# Patient Record
Sex: Female | Born: 1955 | Hispanic: No | Marital: Married | Smoking: Never smoker
Health system: Southern US, Community
[De-identification: ages and names within clinical notes are randomized; demographics above are authoritative.]

## PROBLEM LIST (undated history)

## (undated) DIAGNOSIS — F329 Major depressive disorder, single episode, unspecified: Secondary | ICD-10-CM

## (undated) DIAGNOSIS — F419 Anxiety disorder, unspecified: Secondary | ICD-10-CM

## (undated) DIAGNOSIS — E876 Hypokalemia: Secondary | ICD-10-CM

## (undated) DIAGNOSIS — Z1211 Encounter for screening for malignant neoplasm of colon: Secondary | ICD-10-CM

## (undated) DIAGNOSIS — F32A Depression, unspecified: Secondary | ICD-10-CM

## (undated) DIAGNOSIS — B029 Zoster without complications: Secondary | ICD-10-CM

## (undated) HISTORY — DX: Zoster without complications: B02.9

## (undated) HISTORY — DX: Depression, unspecified: F32.A

## (undated) HISTORY — DX: Anxiety disorder, unspecified: F41.9

## (undated) HISTORY — DX: Hypokalemia: E87.6

## (undated) HISTORY — DX: Major depressive disorder, single episode, unspecified: F32.9

## (undated) HISTORY — DX: Encounter for screening for malignant neoplasm of colon: Z12.11

---

## 2012-06-27 ENCOUNTER — Ambulatory Visit: Payer: Self-pay | Admitting: Family Medicine

## 2012-07-04 ENCOUNTER — Ambulatory Visit: Payer: Self-pay | Admitting: Family Medicine

## 2012-07-07 ENCOUNTER — Encounter: Payer: Self-pay | Admitting: Family Medicine

## 2012-07-07 ENCOUNTER — Ambulatory Visit (INDEPENDENT_AMBULATORY_CARE_PROVIDER_SITE_OTHER): Payer: Self-pay | Admitting: Family Medicine

## 2012-07-07 VITALS — BP 110/64 | HR 68 | Wt 160.0 lb

## 2012-07-07 DIAGNOSIS — F329 Major depressive disorder, single episode, unspecified: Secondary | ICD-10-CM | POA: Insufficient documentation

## 2012-07-07 DIAGNOSIS — F411 Generalized anxiety disorder: Secondary | ICD-10-CM | POA: Insufficient documentation

## 2012-07-07 MED ORDER — CITALOPRAM HYDROBROMIDE 40 MG PO TABS: 40.0000 mg | ORAL_TABLET | Freq: Every day | ORAL | Status: DC

## 2012-07-07 MED ORDER — ALPRAZOLAM 2 MG PO TABS: 2.0000 mg | ORAL_TABLET | Freq: Four times a day (QID) | ORAL | Status: DC | PRN

## 2012-07-07 NOTE — Assessment & Plan Note (Signed)
Stable. Renewed xanax rx.

## 2012-07-07 NOTE — Progress Notes (Signed)
Office Note 07/07/2012  CC:  Chief Complaint  Patient presents with  . Establish Care    refill meds    HPI:  Diana Pittman is a 57 y.o. White female who is here to establish care. Patient's most recent primary MD: Dr. Milford Cage at [facility] in [place], [place]. Old records were not reviewed prior to or during today's visit.  Increased depression last 2 mo: with increased desire to eat and sleep (wt gain).  No motivation to do anything. No SI or HI.  She is only taking 1/2 of her citalopram 40mg  tab.    She has not had GYN exam in about 5 yrs.  She denies any abnl paps in the past.  Declined flu vaccine. Last Tdap was about 3 yrs ago.  Past Medical History  Diagnosis Date  . Depression   . Anxiety     History reviewed. No pertinent past surgical history.  Family History  Problem Relation Age of Onset  . Mental illness Mother   . Arthritis Mother   . Hypertension Mother   . Stroke Mother   . Diabetes Mother   . Mental illness Father   . Arthritis Father   . Hypertension Father   . Stroke Father   . Diabetes Father     History   Social History  . Marital Status: Married    Spouse Name: N/A    Number of Children: N/A  . Years of Education: N/A   Occupational History  . Not on file.   Social History Main Topics  . Smoking status: Never Smoker   . Smokeless tobacco: Never Used  . Alcohol Use: Not on file  . Drug Use: Not on file  . Sexually Active: Not on file   Other Topics Concern  . Not on file   Social History Narrative   Married, 3 sons.   Housewife.   10th grade education.   No T/A/Ds.    Outpatient Encounter Prescriptions as of 07/07/2012  Medication Sig Dispense Refill  . alprazolam (XANAX) 2 MG tablet Take 1 tablet (2 mg total) by mouth 4 (four) times daily as needed for anxiety.  120 tablet  5  . citalopram (CELEXA) 40 MG tablet Take 1 tablet (40 mg total) by mouth daily.  30 tablet  5  . [DISCONTINUED] alprazolam (XANAX) 2 MG tablet Take 2 mg  by mouth 4 (four) times daily as needed for anxiety.      . [DISCONTINUED] citalopram (CELEXA) 40 MG tablet Take 40 mg by mouth daily.       No facility-administered encounter medications on file as of 07/07/2012.    No Known Allergies  ROS Review of Systems  Constitutional: Positive for fatigue. Negative for fever.  HENT: Negative for congestion and sore throat.   Eyes: Negative for visual disturbance.  Respiratory: Negative for cough.   Cardiovascular: Negative for chest pain.  Gastrointestinal: Negative for nausea and abdominal pain.  Genitourinary: Negative for dysuria.  Musculoskeletal: Negative for back pain and joint swelling.  Skin: Negative for rash.  Neurological: Negative for weakness and headaches.  Hematological: Negative for adenopathy.    PE; Blood pressure 110/64, pulse 68, weight 160 lb (72.576 kg), SpO2 94.00%. Gen: Alert, well appearing.  Patient is oriented to person, place, time, and situation. AFFECT: mildly flat.  Lucid thought and speech. ENT:  Eyes: no injection, icteris, swelling, or exudate.  EOMI, PERRLA. Nose: no drainage or turbinate edema/swelling.  No injection or focal lesion.  Mouth: lips without  lesion/swelling.  Oral mucosa pink and moist.   Oropharynx without erythema, exudate, or swelling.  Neck - No masses or thyromegaly or limitation in range of motion CV: RRR, no m/r/g.   LUNGS: CTA bilat, nonlabored resps, good aeration in all lung fields. ABD: soft, NT, ND, BS normal.  No hepatospenomegaly or mass.  No bruits. EXT: no clubbing, cyanosis, or edema.   Pertinent labs:  None today  ASSESSMENT AND PLAN:   New pt:  Depression Increase citalopram to WHOLE 40mg  tab once daily.  Therapeutic expectations and side effect profile of medication discussed today.  Patient's questions answered.   GAD (generalized anxiety disorder) Stable. Renewed xanax rx.   An After Visit Summary was printed and given to the patient.  Return in about 4  weeks (around 08/04/2012) for f/u depression.

## 2012-07-07 NOTE — Assessment & Plan Note (Addendum)
Increase citalopram to WHOLE 40mg  tab once daily.  Therapeutic expectations and side effect profile of medication discussed today.  Patient's questions answered.

## 2012-07-21 ENCOUNTER — Telehealth: Payer: Self-pay | Admitting: Family Medicine

## 2012-07-21 NOTE — Telephone Encounter (Signed)
Diana Pittman that we can not RX antibiotic with an office visit.  He will let [facility] know and they will call back if they want an appt.

## 2012-07-21 NOTE — Telephone Encounter (Signed)
Patient has an infected tooth & her jaw is swollen. Can Dr. Milinda Cave send in Rx for antibiotic until she can get a dental appt set up?

## 2012-08-08 ENCOUNTER — Ambulatory Visit: Payer: Self-pay | Admitting: Family Medicine

## 2012-08-22 ENCOUNTER — Encounter: Payer: Self-pay | Admitting: Family Medicine

## 2012-08-22 ENCOUNTER — Ambulatory Visit (INDEPENDENT_AMBULATORY_CARE_PROVIDER_SITE_OTHER): Payer: Self-pay | Admitting: Family Medicine

## 2012-08-22 VITALS — BP 110/64 | HR 78 | Temp 97.4°F | Ht 63.5 in | Wt 154.0 lb

## 2012-08-22 DIAGNOSIS — F329 Major depressive disorder, single episode, unspecified: Secondary | ICD-10-CM

## 2012-08-22 DIAGNOSIS — F411 Generalized anxiety disorder: Secondary | ICD-10-CM

## 2012-08-22 DIAGNOSIS — F3289 Other specified depressive episodes: Secondary | ICD-10-CM

## 2012-08-22 NOTE — Progress Notes (Signed)
OFFICE NOTE  08/22/2012  CC:  Chief Complaint  Patient presents with  . Follow-up    Depression     HPI: Patient is a 57 y.o. Caucasian female who is here for 6 wk f/u depression. She feels well/back to normal/baseline mental status and mood since going to a full 40mg  tab of celexa. Continues to take 2mg  xanax qid and anxiety is stable.  Pertinent PMH:  Past Medical History  Diagnosis Date  . Depression   . Anxiety     MEDS:  Outpatient Prescriptions Prior to Visit  Medication Sig Dispense Refill  . alprazolam (XANAX) 2 MG tablet Take 1 tablet (2 mg total) by mouth 4 (four) times daily as needed for anxiety.  120 tablet  5  . citalopram (CELEXA) 40 MG tablet Take 1 tablet (40 mg total) by mouth daily.  30 tablet  5   No facility-administered medications prior to visit.    PE: Blood pressure 110/64, pulse 78, temperature 97.4 F (36.3 C), temperature source Oral, height 5' 3.5" (1.613 m), weight 154 lb (69.854 kg), SpO2 96.00%. Gen: Alert, well appearing.  Patient is oriented to person, place, time, and situation. AFFECT: pleasant, lucid thought and speech. No further exam today.  IMPRESSION AND PLAN:  Depression and anxiety: stable. Continue citalopram 40mg  qd and xanax 2mg  qid.  She has RFs of these.  No new rx's today.  FOLLOW UP: 13mo

## 2013-01-08 ENCOUNTER — Telehealth: Payer: Self-pay | Admitting: Family Medicine

## 2013-01-09 ENCOUNTER — Other Ambulatory Visit: Payer: Self-pay | Admitting: Family Medicine

## 2013-01-09 MED ORDER — ALPRAZOLAM 2 MG PO TABS: 2.0000 mg | ORAL_TABLET | Freq: Four times a day (QID) | ORAL | Status: DC | PRN

## 2013-01-09 NOTE — Telephone Encounter (Signed)
Printed rx, faxed to [facility].

## 2013-01-09 NOTE — Telephone Encounter (Signed)
OK to RF as previously prescibed, with 5 additional RFs.-thx

## 2013-01-09 NOTE — Telephone Encounter (Signed)
Rx sent to pharmacy per another note in chart.  Patient aware.

## 2013-01-09 NOTE — Telephone Encounter (Signed)
Patient requesting refill on xanax.  Rx given 07/07/12 x 5 refills.  Last OV 08/22/12, next OV 10/17.  Please advise.

## 2013-01-21 ENCOUNTER — Other Ambulatory Visit: Payer: Self-pay | Admitting: Family Medicine

## 2013-02-27 ENCOUNTER — Ambulatory Visit (INDEPENDENT_AMBULATORY_CARE_PROVIDER_SITE_OTHER): Payer: Self-pay | Admitting: Family Medicine

## 2013-02-27 ENCOUNTER — Encounter: Payer: Self-pay | Admitting: Family Medicine

## 2013-02-27 VITALS — BP 118/72 | HR 58 | Temp 98.8°F | Resp 18 | Ht 63.5 in | Wt 153.0 lb

## 2013-02-27 DIAGNOSIS — F411 Generalized anxiety disorder: Secondary | ICD-10-CM

## 2013-02-27 DIAGNOSIS — F329 Major depressive disorder, single episode, unspecified: Secondary | ICD-10-CM

## 2013-02-27 NOTE — Progress Notes (Signed)
OFFICE NOTE  02/27/2013  CC:  Chief Complaint  Patient presents with  . Anxiety  . Depression     HPI: Patient is a 57 y.o. Caucasian female who is here for 6 mo f/u depression and anxiety. Lots stressing her out lately. Trouble sleeping lots of nights but occ use of benadryl helps. She does take her xanax 4 times per day.  Most recent dose was this morning. Takes 40mg  citalopram daily.  Says mood is stable.  No new questions today.    Pertinent PMH:  Past Medical History  Diagnosis Date  . Depression   . Anxiety    No past surgical history on file.  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   No T/A/Ds.    MEDS:  Outpatient Prescriptions Prior to Visit  Medication Sig Dispense Refill  . alprazolam (XANAX) 2 MG tablet Take 1 tablet (2 mg total) by mouth 4 (four) times daily as needed for anxiety.  120 tablet  5  . citalopram (CELEXA) 40 MG tablet TAKE ONE TABLET BY MOUTH DAILY  30 tablet  1   No facility-administered medications prior to visit.    PE: Blood pressure 118/72, pulse 58, temperature 98.8 F (37.1 C), temperature source Temporal, resp. rate 18, height 5' 3.5" (1.613 m), weight 153 lb (69.4 kg), SpO2 97.00%. Wt Readings from Last 2 Encounters:  02/27/13 153 lb (69.4 kg)  08/22/12 154 lb (69.854 kg)    Gen: alert, oriented x 4, affect pleasant.  Lucid thinking and conversation noted. HEENT: PERRLA, EOMI.   Neck: no LAD, mass, or thyromegaly. CV: RRR, no m/r/g LUNGS: CTA bilat, nonlabored. NEURO: no tremor or tics noted on observation.  Coordination intact. CN 2-12 grossly intact bilaterally, strength 5/5 in all extremeties.  No ataxia.   IMPRESSION AND PLAN:  1) Dep and anx: stable. Continue current meds. Controlled substance contract reviewed with patient today.  Patient signed this and it will be placed in the chart.   UDS at next f/u.  An After Visit Summary was printed and given to  the patient.  FOLLOW UP: 20mo

## 2013-03-30 ENCOUNTER — Other Ambulatory Visit: Payer: Self-pay | Admitting: Family Medicine

## 2013-07-08 ENCOUNTER — Other Ambulatory Visit: Payer: Self-pay | Admitting: Family Medicine

## 2013-07-08 NOTE — Telephone Encounter (Signed)
Last refill 01/09/2013 #120 with 5 RFs. Last OV 02/27/2013

## 2013-07-08 NOTE — Telephone Encounter (Signed)
Alprazolam rx printed. 

## 2013-07-08 NOTE — Telephone Encounter (Signed)
Faxed

## 2013-08-28 ENCOUNTER — Ambulatory Visit (INDEPENDENT_AMBULATORY_CARE_PROVIDER_SITE_OTHER): Payer: Self-pay | Admitting: Family Medicine

## 2013-08-28 ENCOUNTER — Encounter: Payer: Self-pay | Admitting: Family Medicine

## 2013-08-28 VITALS — BP 102/70 | HR 72 | Temp 98.2°F | Resp 18 | Ht 63.5 in | Wt 155.0 lb

## 2013-08-28 DIAGNOSIS — F411 Generalized anxiety disorder: Secondary | ICD-10-CM

## 2013-08-28 DIAGNOSIS — F32A Depression, unspecified: Secondary | ICD-10-CM

## 2013-08-28 DIAGNOSIS — F329 Major depressive disorder, single episode, unspecified: Secondary | ICD-10-CM

## 2013-08-28 DIAGNOSIS — F3289 Other specified depressive episodes: Secondary | ICD-10-CM

## 2013-08-28 MED ORDER — CITALOPRAM HYDROBROMIDE 40 MG PO TABS: ORAL_TABLET | ORAL | Status: DC

## 2013-08-28 NOTE — Progress Notes (Signed)
OFFICE NOTE  08/28/2013  CC:  Chief Complaint  Patient presents with  . Follow-up    6 month     HPI: Patient is a 58 y.o. Caucasian female who is here for 6 mo f/u anxiety and depression, on citalopram and xanax.  Says she is doing well, compliant with meds.  Taking alpraz 3-4 pills per day.   Feels like mood is stable, anxiety well controlled with current regimen. She talked some today about her sons, apparently one is a drug addict without a job.  Another is 60 yrs old and also has drug problems and just recently moved out of pt's home. She feels hurt by her sons.   Has a grandson that comes to her house to visit and this makes her happy. She is maintaining faith in God, using this to help keep her emotionally secure.  Patient has no health insurance and declines any lab screening or cancer screening.  Pertinent PMH:  Past medical, surgical, social, and family history reviewed and no changes are noted since last office visit.  MEDS:  Outpatient Prescriptions Prior to Visit  Medication Sig Dispense Refill  . alprazolam (XANAX) 2 MG tablet TAKE ONE TABLET BY MOUTH FOUR TIMES DAILY AS NEEDED FOR ANXIETY  120 tablet  5  . citalopram (CELEXA) 40 MG tablet TAKE ONE TABLET BY MOUTH DAILY  30 tablet  3   No facility-administered medications prior to visit.    PE: Blood pressure 102/70, pulse 72, temperature 98.2 F (36.8 C), temperature source Temporal, resp. rate 18, height 5' 3.5" (1.613 m), weight 155 lb (70.308 kg), SpO2 98.00%. Wt Readings from Last 2 Encounters:  08/28/13 155 lb (70.308 kg)  02/27/13 153 lb (69.4 kg)   Gen: alert, oriented x 4, affect pleasant.  Lucid thinking and conversation noted. HEENT: PERRLA, EOMI.   Neck: no LAD, mass, or thyromegaly. CV: RRR, no m/r/g LUNGS: CTA bilat, nonlabored. NEURO: no tremor or tics noted on observation.  Coordination intact. CN 2-12 grossly intact bilaterally, strength 5/5 in all extremeties.  No ataxia.  LAB:  none  IMPRESSION AND PLAN:  GAD and dysthymia/depression. Stable on current med regimen. RF'd citalopram today.  An After Visit Summary was printed and given to the patient.  FOLLOW UP: 47mo

## 2013-08-28 NOTE — Progress Notes (Signed)
Pre visit review using our clinic review tool, if applicable. No additional management support is needed unless otherwise documented below in the visit note. 

## 2014-01-04 ENCOUNTER — Other Ambulatory Visit: Payer: Self-pay | Admitting: Family Medicine

## 2014-01-04 NOTE — Telephone Encounter (Signed)
Rf request for alprazolam.  Pt last OV was 08/28/13.  Last RF was 07/08/13 x 5 rfs.  Please advise.

## 2014-02-26 ENCOUNTER — Encounter: Payer: Self-pay | Admitting: Family Medicine

## 2014-02-26 ENCOUNTER — Ambulatory Visit (INDEPENDENT_AMBULATORY_CARE_PROVIDER_SITE_OTHER): Payer: Self-pay | Admitting: Family Medicine

## 2014-02-26 VITALS — BP 105/71 | HR 67 | Temp 97.9°F | Resp 18 | Ht 63.5 in | Wt 141.0 lb

## 2014-02-26 DIAGNOSIS — F329 Major depressive disorder, single episode, unspecified: Secondary | ICD-10-CM

## 2014-02-26 DIAGNOSIS — F32A Depression, unspecified: Secondary | ICD-10-CM

## 2014-02-26 DIAGNOSIS — F411 Generalized anxiety disorder: Secondary | ICD-10-CM

## 2014-02-26 MED ORDER — CITALOPRAM HYDROBROMIDE 40 MG PO TABS: ORAL_TABLET | ORAL | Status: DC

## 2014-02-26 NOTE — Progress Notes (Signed)
Pre visit review using our clinic review tool, if applicable. No additional management support is needed unless otherwise documented below in the visit note. 

## 2014-02-26 NOTE — Progress Notes (Signed)
OFFICE NOTE  02/26/2014  CC:  Chief Complaint  Patient presents with  . Follow-up     HPI: Patient is a 58 y.o. Caucasian female who is here for 6 mo f/u anxiety and depression. Stable mood and anxiety level.  Lots of stress/family issues in her life as per her usual. Pt has long declined typical preventative and screening procedures, including flu vaccine today. She has been trying to eat better and exercise more in order to lose wt. She has successfully lost 14 lbs since last o/v here.  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   Pertinent PMH:  Past medical, surgical, social, and family history reviewed and no changes are noted since last office visit.  MEDS:  Outpatient Prescriptions Prior to Visit  Medication Sig Dispense Refill  . alprazolam (XANAX) 2 MG tablet TAKE ONE TABLET BY MOUTH FOUR TIMES DAILY AS NEEDED FOR ANXIETY  120 tablet  5  . citalopram (CELEXA) 40 MG tablet TAKE ONE TABLET BY MOUTH DAILY  30 tablet  6   No facility-administered medications prior to visit.    PE: Blood pressure 105/71, pulse 67, temperature 97.9 F (36.6 C), temperature source Temporal, resp. rate 18, height 5' 3.5" (1.613 m), weight 141 lb (63.957 kg), SpO2 99.00%. Wt Readings from Last 2 Encounters:  02/26/14 141 lb (63.957 kg)  08/28/13 155 lb (70.308 kg)   Gen: alert, oriented x 4, affect pleasant.  Lucid thinking and conversation noted. HEENT: PERRLA, EOMI.   Neck: no LAD, mass, or thyromegaly. CV: RRR, no m/r/g LUNGS: CTA bilat, nonlabored. NEURO: no tremor or tics noted on observation.  Coordination intact. CN 2-12 grossly intact bilaterally, strength 5/5 in all extremeties.  No ataxia.   IMPRESSION AND PLAN:  Anxiety and depression: stable. The current medical regimen is effective;  continue present plan and medications.  An After Visit Summary was printed and given to the  patient.  FOLLOW UP: 60mo

## 2014-03-01 ENCOUNTER — Ambulatory Visit: Payer: Self-pay | Admitting: Family Medicine

## 2014-06-17 ENCOUNTER — Other Ambulatory Visit: Payer: Self-pay | Admitting: Family Medicine

## 2014-06-17 MED ORDER — ALPRAZOLAM 2 MG PO TABS: ORAL_TABLET | ORAL | Status: DC

## 2014-08-13 ENCOUNTER — Other Ambulatory Visit: Payer: Self-pay | Admitting: Family Medicine

## 2014-08-13 MED ORDER — HYDROCODONE-ACETAMINOPHEN 5-325 MG PO TABS: 1.0000 | ORAL_TABLET | Freq: Four times a day (QID) | ORAL | Status: DC | PRN

## 2014-09-16 ENCOUNTER — Ambulatory Visit: Payer: Self-pay | Admitting: Family Medicine

## 2014-09-27 ENCOUNTER — Telehealth: Payer: Self-pay | Admitting: Family Medicine

## 2014-09-27 NOTE — Telephone Encounter (Signed)
LM for pt to see if she had her mammogram or would like to schedule it.

## 2014-10-06 ENCOUNTER — Telehealth: Payer: Self-pay | Admitting: Family Medicine

## 2014-10-14 ENCOUNTER — Encounter: Payer: Self-pay | Admitting: Family Medicine

## 2014-10-14 ENCOUNTER — Ambulatory Visit (INDEPENDENT_AMBULATORY_CARE_PROVIDER_SITE_OTHER): Payer: Self-pay | Admitting: Family Medicine

## 2014-10-14 VITALS — BP 95/67 | HR 69 | Temp 98.0°F | Resp 16 | Wt 139.0 lb

## 2014-10-14 DIAGNOSIS — R2232 Localized swelling, mass and lump, left upper limb: Secondary | ICD-10-CM

## 2014-10-14 DIAGNOSIS — F418 Other specified anxiety disorders: Secondary | ICD-10-CM

## 2014-10-14 DIAGNOSIS — F329 Major depressive disorder, single episode, unspecified: Secondary | ICD-10-CM

## 2014-10-14 DIAGNOSIS — F419 Anxiety disorder, unspecified: Principal | ICD-10-CM

## 2014-10-14 DIAGNOSIS — K088 Other specified disorders of teeth and supporting structures: Secondary | ICD-10-CM

## 2014-10-14 DIAGNOSIS — K0889 Other specified disorders of teeth and supporting structures: Secondary | ICD-10-CM

## 2014-10-14 MED ORDER — CITALOPRAM HYDROBROMIDE 40 MG PO TABS: ORAL_TABLET | ORAL | Status: DC

## 2014-10-14 MED ORDER — HYDROCODONE-ACETAMINOPHEN 5-325 MG PO TABS: 1.0000 | ORAL_TABLET | Freq: Four times a day (QID) | ORAL | Status: DC | PRN

## 2014-10-14 MED ORDER — ALPRAZOLAM 2 MG PO TABS: ORAL_TABLET | ORAL | Status: DC

## 2014-10-14 NOTE — Progress Notes (Signed)
OFFICE VISIT  10/24/2014   CC:  Chief Complaint  Patient presents with  . Follow-up    Pt is not fasting.   HPI:    Patient is a 59 y.o. Caucasian female who presents for 8 mo f/u anxiety and depression. She wants to get health insurance but is finding it confusing and frustrating and [place]  Due to these financial constraints, she has long deferred any screening tests. She says her anxiety and depression are stable on current meds at current doses.  She has tooth pain in several areas and has dental appt 10/25/14 to start getting some teeth extracted. Vicodin 5/325 1 tab at a time helps diminish the pain to a tolerable level and she asks for some more of these to last until her upcoming appt. The dentist she saw recently put her back on amoxil tid.  The dentist she sees has some specific rules regarding meds and no pain med stronger than motrin is ever rx'd.  Has noted slight focal swelling in left arm in medial antecubital fossa region, first saw it a few weeks ago.  It has stayed stable size.  NO pain. No fevers or night sweats or abnormal wt loss.  She has not noted any swelling in any other area of her body.   Past Medical History  Diagnosis Date  . Depression   . Anxiety     History reviewed. No pertinent past surgical history.  Outpatient Prescriptions Prior to Visit  Medication Sig Dispense Refill  . alprazolam (XANAX) 2 MG tablet TAKE ONE TABLET BY MOUTH FOUR TIMES DAILY AS NEEDED FOR ANXIETY 120 tablet 5  . citalopram (CELEXA) 40 MG tablet TAKE ONE TABLET BY MOUTH DAILY 30 tablet 6  . HYDROcodone-acetaminophen (NORCO/VICODIN) 5-325 MG per tablet Take 1 tablet by mouth every 6 (six) hours as needed for moderate pain. (Patient not taking: Reported on 10/14/2014) 60 tablet 0   No facility-administered medications prior to visit.    No Known Allergies  ROS As per HPI  PE: Blood pressure 95/67, pulse 69,  temperature 98 F (36.7 C), temperature source Oral, resp. rate 16, weight 139 lb (63.05 kg), SpO2 98 %. Wt Readings from Last 2 Encounters:  10/14/14 139 lb (63.05 kg)  02/26/14 141 lb (63.957 kg)   Gen: alert, oriented x 4, affect pleasant.  Lucid thinking and conversation noted. HEENT: PERRLA, EOMI.   Neck: no LAD, mass, or thyromegaly. CV: RRR, no m/r/g LUNGS: CTA bilat, nonlabored. NEURO: no tremor or tics noted on observation.  Coordination intact. CN 2-12 grossly intact bilaterally, strength 5/5 in all extremeties.  No ataxia. Medial aspect of left antecubital fossa with generalized, mild soft tissue swelling.  No tenderness, erythema, or fluctuance. Not pulsatile.   LABS:  none  IMPRESSION AND PLAN:  1) Anxiety and depression: The current medical regimen is effective;  continue present plan and medications.  2) Left antecubital fossa mass, ? Lymph node? Vs muscular swelling. Ultrasound of the area has been ordered to further evaluate.  3) Dental pain: Vicodin 5/325, 1-2 tabs q6h prn, #60.   Pt to continue with dental work, getting a couple of teeth worked on at a time.  4) Financial problems: she defers screening/prev health care due to this.  An After Visit Summary was printed and given to the patient.  FOLLOW UP: Return in about 1 year (around 10/14/2015) for routine chronic illness f/u.

## 2014-10-18 ENCOUNTER — Ambulatory Visit (HOSPITAL_COMMUNITY)
Admission: RE | Admit: 2014-10-18 | Discharge: 2014-10-18 | Disposition: A | Discharge status: Home / Self Care | Payer: Self-pay | Source: Ambulatory Visit | Attending: Family Medicine | Admitting: Family Medicine

## 2014-10-18 DIAGNOSIS — R2232 Localized swelling, mass and lump, left upper limb: Secondary | ICD-10-CM

## 2014-10-21 NOTE — Telephone Encounter (Signed)
Opened in error/dh °

## 2015-04-28 ENCOUNTER — Other Ambulatory Visit: Payer: Self-pay | Admitting: *Deleted

## 2015-04-28 NOTE — Telephone Encounter (Signed)
RF request for alprazolam LOV: 10/14/14  Next ov: None Last written: 10/14/14 #120 w/ 5RF  Please advise. Thanks.

## 2015-04-29 MED ORDER — ALPRAZOLAM 2 MG PO TABS: ORAL_TABLET | ORAL | Status: DC

## 2015-04-29 NOTE — Telephone Encounter (Signed)
Please advise. Thanks.  

## 2015-04-29 NOTE — Telephone Encounter (Signed)
Rx faxed. Tried calling pt NA and unable to leave a message.

## 2015-04-29 NOTE — Telephone Encounter (Signed)
Xanax Rx [facility]. Patient does not run out until 05/04/15 but pharmacy advised patient to go ahead and contact us due to the holidays.

## 2015-05-02 ENCOUNTER — Other Ambulatory Visit: Payer: Self-pay | Admitting: *Deleted

## 2015-05-02 MED ORDER — HYDROCODONE-ACETAMINOPHEN 5-325 MG PO TABS: 1.0000 | ORAL_TABLET | Freq: Four times a day (QID) | ORAL | Status: DC | PRN

## 2015-05-02 NOTE — Telephone Encounter (Signed)
Pt called requesting refill for Norco. She stated that Dr. Milinda CaveMcGowen prescribed this for her teeth. She stated that she had an apt with a dentist but the had to reschedule it and now she can not be seen til March. Please advise. Thanks.   RF request for Hydro/APAP LOV: 10/14/14 Next ov: None Last written: 10/14/14 #60 w/ Wende [id number]

## 2015-05-02 NOTE — Telephone Encounter (Signed)
Rx put up front for p/u. Pt advised and voiced understanding.   

## 2015-08-19 ENCOUNTER — Other Ambulatory Visit: Payer: Self-pay | Admitting: *Deleted

## 2015-08-19 MED ORDER — HYDROCODONE-ACETAMINOPHEN 5-325 MG PO TABS: 1.0000 | ORAL_TABLET | Freq: Four times a day (QID) | ORAL | Status: DC | PRN

## 2015-08-19 NOTE — Telephone Encounter (Signed)
Rx put up front for p/u. Pt advised and voiced understanding.   

## 2015-08-19 NOTE — Telephone Encounter (Signed)
Pt called requesting a refill for the hydrocodone. He stated that she is having some dental pain from a wisdom tooth. She stated that the has been seeing her dentist and they told her the only thing they can do is remove the tooth. She stated that the only thing the have recommended for the pain in motrin. Please advise. Thanks.

## 2015-09-30 ENCOUNTER — Other Ambulatory Visit: Payer: Self-pay | Admitting: Family Medicine

## 2015-10-03 NOTE — Telephone Encounter (Signed)
RF request for alprazolam LOV: 10/14/14 Next ov: None Last written: 04/29/15 #120 w/ 5RF  Please advise. Thanks.

## 2015-10-03 NOTE — Telephone Encounter (Signed)
Rx faxed

## 2015-11-07 ENCOUNTER — Other Ambulatory Visit: Payer: Self-pay | Admitting: Family Medicine

## 2016-02-24 ENCOUNTER — Other Ambulatory Visit: Payer: Self-pay | Admitting: Family Medicine

## 2016-03-16 ENCOUNTER — Ambulatory Visit: Payer: Self-pay | Admitting: Family Medicine

## 2016-03-22 ENCOUNTER — Encounter: Payer: Self-pay | Admitting: Family Medicine

## 2016-03-22 ENCOUNTER — Ambulatory Visit (INDEPENDENT_AMBULATORY_CARE_PROVIDER_SITE_OTHER): Payer: Self-pay | Admitting: Family Medicine

## 2016-03-22 VITALS — BP 115/70 | HR 78 | Temp 99.8°F | Resp 16 | Wt 142.1 lb

## 2016-03-22 DIAGNOSIS — F329 Major depressive disorder, single episode, unspecified: Secondary | ICD-10-CM

## 2016-03-22 DIAGNOSIS — F418 Other specified anxiety disorders: Secondary | ICD-10-CM

## 2016-03-22 DIAGNOSIS — F419 Anxiety disorder, unspecified: Principal | ICD-10-CM

## 2016-03-22 MED ORDER — ALPRAZOLAM 2 MG PO TABS: ORAL_TABLET | ORAL | 5 refills | Status: DC

## 2016-03-22 MED ORDER — DULOXETINE HCL 30 MG PO CPEP: ORAL_CAPSULE | ORAL | 1 refills | Status: DC

## 2016-03-22 NOTE — Progress Notes (Signed)
OFFICE VISIT  03/22/2016   CC:  Chief Complaint  Patient presents with  . Follow-up    depression    HPI:    Patient is a 60 y.o. Caucasian female who presents for f/u anxiety and depression. It has been over a year since her last f/u visit, primarily due to financial reasons. Says she was doing ok/stable, and then about a month ago her depression acutely worsened for no clear reason.  Has remained depressed the last month.  Finds herself chronically sad, cries easily, irritable and snappy.  No SI or HI.  +Anhedonia.   Past Medical History:  Diagnosis Date  . Anxiety   . Depression     History reviewed. No pertinent surgical history.  Outpatient Medications Prior to Visit  Medication Sig Dispense Refill  . alprazolam (XANAX) 2 MG tablet TAKE ONE TABLET BY MOUTH FOUR TIMES DAILY AS NEEDED FOR ANXIETY 120 tablet 5  . citalopram (CELEXA) 40 MG tablet TAKE ONE TABLET BY MOUTH DAILY 30 tablet 3  . HYDROcodone-acetaminophen (NORCO/VICODIN) 5-325 MG tablet Take 1-2 tablets by mouth every 6 (six) hours as needed for moderate pain. 60 tablet 0   No facility-administered medications prior to visit.     No Known Allergies  ROS As per HPI  PE: Blood pressure 115/70, pulse 78, temperature 99.8 F (37.7 C), resp. rate 16, weight 142 lb 1.9 oz (64.5 kg), SpO2 99 %. Wt Readings from Last 2 Encounters:  03/22/16 142 lb 1.9 oz (64.5 kg)  10/14/14 139 lb (63 kg)    Gen: alert, oriented x 4, affect pleasant.  Lucid thinking and conversation noted. HEENT: PERRLA, EOMI.   Neck: no LAD, mass, or thyromegaly. CV: RRR, no m/r/g LUNGS: CTA bilat, nonlabored. NEURO: no tremor or tics noted on observation.  Coordination intact. CN 2-12 grossly intact bilaterally, strength 5/5 in all extremeties.  No ataxia.   LABS:  none  IMPRESSION AND PLAN:  1) Chronic anxiety: stable.  RF'd xanax today.  2) Chronic depression, worse the last 4-5 wks. D/c citalopram and start duloxetine 30mg   qd.  Increase to TWO 30mg  caps in 2wks. Recheck in office in 1 mo.  An After Visit Summary was printed and given to the patient.  FOLLOW UP: Return in about 4 weeks (around 04/19/2016) for f/u depr.  Signed:  Santiago BumpersPhil Nikeia Henkes, MD           03/22/2016

## 2016-03-22 NOTE — Progress Notes (Signed)
Pre visit review using our clinic review tool, if applicable. No additional management support is needed unless otherwise documented below in the visit note. 

## 2016-03-28 ENCOUNTER — Other Ambulatory Visit: Payer: Self-pay | Admitting: Family Medicine

## 2016-04-19 ENCOUNTER — Ambulatory Visit: Payer: Self-pay | Admitting: Family Medicine

## 2016-05-18 ENCOUNTER — Ambulatory Visit: Payer: Self-pay | Admitting: Family Medicine

## 2016-05-24 ENCOUNTER — Encounter: Payer: Self-pay | Admitting: Family Medicine

## 2016-05-24 ENCOUNTER — Ambulatory Visit (INDEPENDENT_AMBULATORY_CARE_PROVIDER_SITE_OTHER): Payer: Self-pay | Admitting: Family Medicine

## 2016-05-24 VITALS — BP 109/73 | HR 77 | Temp 97.5°F | Resp 16 | Ht 63.5 in | Wt 146.5 lb

## 2016-05-24 DIAGNOSIS — F329 Major depressive disorder, single episode, unspecified: Secondary | ICD-10-CM

## 2016-05-24 DIAGNOSIS — F418 Other specified anxiety disorders: Secondary | ICD-10-CM

## 2016-05-24 DIAGNOSIS — F419 Anxiety disorder, unspecified: Principal | ICD-10-CM

## 2016-05-24 DIAGNOSIS — M25562 Pain in left knee: Secondary | ICD-10-CM

## 2016-05-24 MED ORDER — CELECOXIB 200 MG PO CAPS: ORAL_CAPSULE | ORAL | 1 refills | Status: DC

## 2016-05-24 MED ORDER — DULOXETINE HCL 60 MG PO CPEP: 60.0000 mg | ORAL_CAPSULE | Freq: Every day | ORAL | 3 refills | Status: DC

## 2016-05-24 NOTE — Progress Notes (Signed)
Pre visit review using our clinic review tool, if applicable. No additional management support is needed unless otherwise documented below in the visit note. 

## 2016-05-24 NOTE — Progress Notes (Signed)
OFFICE VISIT  05/24/2016   CC:  Chief Complaint  Patient presents with  . Follow-up    Depression   HPI:    Patient is a 61 y.o. Caucasian female who presents for 2 mo f/u depression and anxiety. Last visit her depression had been worse so I d/c'd citalopram and started duloxetine. She has titrated up to the 60mg  qd dosing. Doing better, not as irritable.  Crying spells are much less frequent.  No SI or HI.   Appetite is fine.  Energy level stable.    Last 1-2 mo has pain in L knee a lot in middle of night, pain extends down L calf some. No knee swelling or redness.  Tried advil a couple times and it was not very effective.   Past Medical History:  Diagnosis Date  . Anxiety   . Depression     History reviewed. No pertinent surgical history.  Outpatient Medications Prior to Visit  Medication Sig Dispense Refill  . alprazolam (XANAX) 2 MG tablet TAKE ONE TABLET BY MOUTH FOUR TIMES DAILY AS NEEDED FOR ANXIETY 120 tablet 5  . DULoxetine (CYMBALTA) 30 MG capsule 1-2 caps po qd (Patient taking differently: 2 caps po qd) 60 capsule 1   No facility-administered medications prior to visit.     No Known Allergies  ROS As per HPI  PE: Blood pressure 109/73, pulse 77, temperature 97.5 F (36.4 C), temperature source Oral, resp. rate 16, height 5' 3.5" (1.613 m), weight 146 lb 8 oz (66.5 kg), SpO2 99 %. Wt Readings from Last 2 Encounters:  05/24/16 146 lb 8 oz (66.5 kg)  03/22/16 142 lb 1.9 oz (64.5 kg)    Gen: alert, oriented x 4, affect pleasant.  Lucid thinking and conversation noted. HEENT: PERRLA, EOMI.   Neck: no LAD, mass, or thyromegaly. CV: RRR, no m/r/g LUNGS: CTA bilat, nonlabored. NEURO: no tremor or tics noted on observation.  Coordination intact. CN 2-12 grossly intact bilaterally, strength 5/5 in all extremeties.  No ataxia. L knee: mild crepitus, mild pain with flexion/extension.  No effusion or warmth.  No palpable tenderness. Calf: no swelling or edema  or tenderness.  LABS:  none  IMPRESSION AND PLAN:  1) Depression and anxiety: much improved, stable. The current medical regimen is effective;  continue present plan and medications.  2) Left knee pain: pt self pay/financial constraints play a large role in what she will allow me to do as far as work-up.  We'll treat with celebrex 200 mg qpm prn and see how this goes. If this is not helpful and she calls to request narcotic pain med, I'll make sure I get an x-ray of her knee first.  An After Visit Summary was printed and given to the patient.  FOLLOW UP: Return in about 6 months (around 11/21/2016) for routine chronic illness f/u.  Signed:  Santiago BumpersPhil Micca Matura, MD           05/24/2016

## 2016-06-08 ENCOUNTER — Telehealth: Payer: Self-pay | Admitting: *Deleted

## 2016-06-08 MED ORDER — CITALOPRAM HYDROBROMIDE 40 MG PO TABS: 40.0000 mg | ORAL_TABLET | Freq: Every day | ORAL | 12 refills | Status: DC

## 2016-06-08 NOTE — Telephone Encounter (Signed)
Pt called wanting to know if she could go back to her other depression medication. She stated that the Cymbalta is not helping. She stated that the Celexa worked better. She stated that when she spoke to you about changing it she had a lot going on. Please advise. Thanks. Pharm. [facility]

## 2016-06-08 NOTE — Telephone Encounter (Signed)
Tried calling NA and unable to leave message.  

## 2016-06-08 NOTE — Telephone Encounter (Signed)
OK. Citalopram eRx'd.

## 2016-06-08 NOTE — Telephone Encounter (Signed)
Pt advised and voiced understanding.   

## 2016-06-22 ENCOUNTER — Other Ambulatory Visit: Payer: Self-pay | Admitting: Family Medicine

## 2016-06-22 NOTE — Telephone Encounter (Signed)
[facility].  RF request for celebrex LOV: 05/24/16 Next ov: 11/21/16 Last written: 05/24/16 #30 w/ 1RF  Please advise. Thanks.

## 2016-09-20 ENCOUNTER — Other Ambulatory Visit: Payer: Self-pay | Admitting: Family Medicine

## 2016-09-21 NOTE — Telephone Encounter (Signed)
[facility].  RF request for alprazolam LOV: 05/24/16 Next ov: 11/21/16 Last written: 03/22/16 #120 w/ 5RF  Please advise. Thanks.

## 2016-09-21 NOTE — Telephone Encounter (Signed)
Rx faxed

## 2016-11-21 ENCOUNTER — Ambulatory Visit: Payer: Self-pay | Admitting: Family Medicine

## 2016-12-20 ENCOUNTER — Encounter: Payer: Self-pay | Admitting: Family Medicine

## 2016-12-20 ENCOUNTER — Ambulatory Visit (INDEPENDENT_AMBULATORY_CARE_PROVIDER_SITE_OTHER): Payer: Self-pay | Admitting: Family Medicine

## 2016-12-20 VITALS — BP 122/80 | HR 81 | Temp 97.8°F | Resp 16 | Wt 153.0 lb

## 2016-12-20 DIAGNOSIS — F411 Generalized anxiety disorder: Secondary | ICD-10-CM

## 2016-12-20 DIAGNOSIS — F3341 Major depressive disorder, recurrent, in partial remission: Secondary | ICD-10-CM

## 2016-12-20 NOTE — Progress Notes (Signed)
OFFICE VISIT  12/20/2016   CC:  Chief Complaint  Patient presents with  . Depression    follow up     HPI:    Patient is a 61 y.o. Caucasian female who presents for f/u depression and anxiety. Last visit she was doing fine on duloxetine 60mg  qd. However, she then felt that this med made her more depressed, so she switched back to citalopram 40 mg qd. Now says mood is stable, feels like she is more herself--not quite full remission, though.  Says her sons are still driving her crazy. Takes 2 mg alprazolam qid and this helps anxiety well. Celebrex seems to help for her knee pain, takes this a couple times a week.  Past Medical History:  Diagnosis Date  . Anxiety   . Depression     History reviewed. No pertinent surgical history.  Outpatient Medications Prior to Visit  Medication Sig Dispense Refill  . alprazolam (XANAX) 2 MG tablet TAKE ONE TABLET BY MOUTH FOUR TIMES DAILY AS NEEDED FOR ANXIETY 120 tablet 5  . celecoxib (CELEBREX) 200 MG capsule TAKE ONE CAPSULE BY MOUTH EVERY EVENING AS NEEDED FOR KNEE PAIN. 30 capsule 3  . citalopram (CELEXA) 40 MG tablet Take 1 tablet (40 mg total) by mouth daily. 30 tablet 12   No facility-administered medications prior to visit.     No Known Allergies  ROS As per HPI  PE: Blood pressure 122/80, pulse 81, temperature 97.8 F (36.6 C), temperature source Oral, resp. rate 16, weight 153 lb (69.4 kg), SpO2 99 %. Wt Readings from Last 2 Encounters:  12/20/16 153 lb (69.4 kg)  05/24/16 146 lb 8 oz (66.5 kg)    Gen: alert, oriented x 4, affect pleasant.  Lucid thinking and conversation noted. HEENT: PERRLA, EOMI.   Neck: no LAD, mass, or thyromegaly. CV: RRR, no m/r/g LUNGS: CTA bilat, nonlabored. NEURO: no tremor or tics noted on observation.  Coordination intact. CN 2-12 grossly intact bilaterally, strength 5/5 in all extremeties.  No ataxia.   LABS:    Chemistry   No results found for: NA, K, CL, CO2, BUN, CREATININE, GLU  No results found for: CALCIUM, ALKPHOS, AST, ALT, BILITOT    IMPRESSION AND PLAN:  1) MDD, recurrent, w/out psychotic features, in partial remission. She wants to make no med changes today, and I agree with this plan.  2) GAD; stable on citalopram and xanax at current dosing. No rx RF's needed today.  An After Visit Summary was printed and given to the patient.  FOLLOW UP: Return in about 6 months (around 06/22/2017) for routine chronic illness f/u.  Signed:  Santiago BumpersPhil Montine Hight, MD           12/20/2016

## 2017-02-18 ENCOUNTER — Other Ambulatory Visit: Payer: Self-pay | Admitting: Family Medicine

## 2017-02-19 NOTE — Telephone Encounter (Addendum)
[facility]  RF request for alprazolam LOV: 12/20/16 Next ov: 06/20/17 Last written: 09/21/16 #120 w/ 5RF  Please advise. Thanks.

## 2017-02-22 NOTE — Telephone Encounter (Signed)
Rx called into [facility].

## 2017-03-14 DIAGNOSIS — B029 Zoster without complications: Secondary | ICD-10-CM

## 2017-03-14 HISTORY — DX: Zoster without complications: B02.9

## 2017-04-01 ENCOUNTER — Encounter (HOSPITAL_COMMUNITY): Payer: Self-pay

## 2017-04-01 ENCOUNTER — Other Ambulatory Visit: Payer: Self-pay

## 2017-04-01 ENCOUNTER — Emergency Department (HOSPITAL_COMMUNITY)
Admission: EM | Admit: 2017-04-01 | Discharge: 2017-04-01 | Disposition: A | Discharge status: Home / Self Care | Payer: Self-pay | Attending: Emergency Medicine | Admitting: Emergency Medicine

## 2017-04-01 ENCOUNTER — Emergency Department (HOSPITAL_COMMUNITY): Payer: Self-pay

## 2017-04-01 DIAGNOSIS — N390 Urinary tract infection, site not specified: Secondary | ICD-10-CM | POA: Insufficient documentation

## 2017-04-01 DIAGNOSIS — R319 Hematuria, unspecified: Secondary | ICD-10-CM | POA: Insufficient documentation

## 2017-04-01 DIAGNOSIS — R300 Dysuria: Secondary | ICD-10-CM | POA: Insufficient documentation

## 2017-04-01 DIAGNOSIS — B029 Zoster without complications: Secondary | ICD-10-CM | POA: Insufficient documentation

## 2017-04-01 LAB — URINALYSIS, ROUTINE W REFLEX MICROSCOPIC
Bilirubin Urine: NEGATIVE
Glucose, UA: NEGATIVE mg/dL
Ketones, ur: 20 mg/dL — AB
Nitrite: NEGATIVE
Protein, ur: NEGATIVE mg/dL
Specific Gravity, Urine: 1.015 (ref 1.005–1.030)
pH: 5 (ref 5.0–8.0)

## 2017-04-01 MED ORDER — CEPHALEXIN 500 MG PO CAPS: 500.0000 mg | ORAL_CAPSULE | Freq: Four times a day (QID) | ORAL | 0 refills | Status: DC

## 2017-04-01 MED ORDER — CEPHALEXIN 500 MG PO CAPS
500.0000 mg | ORAL_CAPSULE | Freq: Once | ORAL | Status: AC
  Administered 2017-04-01: 500 mg via ORAL
  Filled 2017-04-01: qty 1

## 2017-04-01 MED ORDER — ACYCLOVIR 800 MG PO TABS
800.0000 mg | ORAL_TABLET | Freq: Once | ORAL | Status: AC
  Administered 2017-04-01: 800 mg via ORAL
  Filled 2017-04-01: qty 1

## 2017-04-01 MED ORDER — HYDROCODONE-ACETAMINOPHEN 5-325 MG PO TABS: 0.5000 | ORAL_TABLET | Freq: Four times a day (QID) | ORAL | 0 refills | Status: DC | PRN

## 2017-04-01 MED ORDER — HYDROCODONE-ACETAMINOPHEN 5-325 MG PO TABS
1.0000 | ORAL_TABLET | Freq: Once | ORAL | Status: AC
  Administered 2017-04-01: 1 via ORAL
  Filled 2017-04-01: qty 1

## 2017-04-01 MED ORDER — IBUPROFEN 400 MG PO TABS: 400.0000 mg | ORAL_TABLET | Freq: Four times a day (QID) | ORAL | 0 refills | Status: DC | PRN

## 2017-04-01 MED ORDER — ACYCLOVIR 400 MG PO TABS: 800.0000 mg | ORAL_TABLET | Freq: Four times a day (QID) | ORAL | 0 refills | Status: AC

## 2017-04-01 NOTE — ED Triage Notes (Signed)
Right flank pain that radiates into RLQ x1 week. Also reports of dysuria.

## 2017-04-01 NOTE — ED Provider Notes (Signed)
Emergency Department Provider Note   I have reviewed the triage vital signs and the nursing notes.   HISTORY  Chief Complaint Flank Pain   HPI Diana Pittman is a 61 y.o. female without too many medical problems or presents to the emergency department today with a couple weeks of dysuria, malodorous urine and general malaise.  Over the last week she is also had a rash on her abdomen and pain that radiated from her right flank down into her groin.  She looked up on the Internet and thought she might have ovarian cancer versus shingles versus of the L she is not sure of so she came here for evaluation.  No fevers or nausea or vomiting but has had decreased appetite.  No other associated or modifying symptoms.  No history of the same but does have a history of chickenpox.   Past Medical History:  Diagnosis Date  . Anxiety   . Depression     Patient Active Problem List   Diagnosis Date Noted  . Depression 07/07/2012  . GAD (generalized anxiety disorder) 07/07/2012    History reviewed. No pertinent surgical history.  Current Outpatient Rx  . Order #: [id number] Class: Print  . Order #: [id number] Class: Print  . Order #: [id number] Class: Normal  . Order #: [id number] Class: Normal  . Order #: [id number] Class: Print  . Order #: [id number] Class: Print    Allergies Patient has no known allergies.  Family History  Problem Relation Age of Onset  . Mental illness Mother   . Arthritis Mother   . Hypertension Mother   . Stroke Mother   . Diabetes Mother   . Mental illness Father   . Arthritis Father   . Hypertension Father   . Stroke Father   . Diabetes Father     Social History Social History   Tobacco Use  . Smoking status: Never Smoker  . Smokeless tobacco: Never Used  Substance Use Topics  . Alcohol use: No  . Drug use: No    Review of Systems  All other systems negative except as documented in the HPI. All pertinent positives and negatives as reviewed in the  HPI. ____________________________________________   PHYSICAL EXAM:  VITAL SIGNS: ED Triage Vitals  Enc Vitals Group     BP 04/01/17 1457 (!) 128/96     Pulse Rate 04/01/17 1457 99     Resp 04/01/17 1457 15     Temp 04/01/17 1457 98.6 F (37 C)     Temp Source 04/01/17 1457 Oral     SpO2 04/01/17 1457 99 %     Weight 04/01/17 1458 160 lb (72.6 kg)     Height 04/01/17 1458 5\' 7"  (1.702 m)     Head Circumference --      Peak Flow --      Pain Score 04/01/17 1457 5     Pain Loc --      Pain Edu? --      Excl. in GC? --     Constitutional: Alert and oriented. Well appearing and in no acute distress. Eyes: Conjunctivae are normal. PERRL. EOMI. Head: Atraumatic. Nose: No congestion/rhinnorhea. Mouth/Throat: Mucous membranes are moist.  Oropharynx non-erythematous. Neck: No stridor.  No meningeal signs.   Cardiovascular: Normal rate, regular rhythm. Good peripheral circulation. Grossly normal heart sounds.   Respiratory: Normal respiratory effort.  No retractions. Lungs CTAB. Gastrointestinal: Soft and nontender. No distention.  Musculoskeletal: No lower extremity tenderness nor edema. No gross deformities of extremities. Neurologic:  Normal  speech and language. No gross focal neurologic deficits are appreciated.  Skin:  Skin is warm, dry and intact. Group of approximatly 10 vesicular lesions in dermatomal distribution on right abdomen in various stages of healing.    ____________________________________________   LABS (all labs ordered are listed, but only abnormal results are displayed)  Labs Reviewed  URINALYSIS, ROUTINE W REFLEX MICROSCOPIC - Abnormal; Notable for the following components:      Result Value   Hgb urine dipstick SMALL (*)    Ketones, ur 20 (*)    Leukocytes, UA LARGE (*)    Bacteria, UA RARE (*)    Squamous Epithelial / LPF 0-5 (*)    Non Squamous Epithelial 0-5 (*)    All other components within normal limits    ____________________________________________  RADIOLOGY  Ct Renal Stone Study  Result Date: 04/01/2017 CLINICAL DATA:  Right flank pain radiating to the right lower quadrant x1 week. EXAM: CT ABDOMEN AND PELVIS WITHOUT CONTRAST TECHNIQUE: Multidetector CT imaging of the abdomen and pelvis was performed following the standard protocol without IV contrast. COMPARISON:  None. FINDINGS: Lower chest: No acute abnormality. Hepatobiliary: 2.8 x 2.3 x 2.1 cm left hepatic cyst. No biliary dilatation. Unremarkable gallbladder. Pancreas: Unremarkable. No pancreatic ductal dilatation or surrounding inflammatory changes. Spleen: Normal in size without focal abnormality. Adrenals/Urinary Tract: Adrenal glands are unremarkable. Kidneys are normal, without renal calculi, focal lesion, or hydronephrosis. Bladder is unremarkable. Stomach/Bowel: Stomach is within normal limits. Appendix is normal in caliber without inflammation. No evidence of bowel wall thickening, distention, or inflammatory changes. Vascular/Lymphatic: No significant vascular findings are present. No enlarged abdominal or pelvic lymph nodes. Calcified lymph node in the left hemipelvis. Reproductive: 4 mm calcification within the myometrium of the left uterus is consistent with a calcified uterine fibroid. No adnexal mass. Other: Tiny fat containing umbilical hernia. No abdominopelvic ascites. Musculoskeletal: Vacuum disc phenomenon with disc flattening L4-5. Moderate-to-marked disc space narrowing at T12-L1. No acute osseous abnormality. IMPRESSION: 1. No nephrolithiasis nor obstructive uropathy. 2. Simple appearing left hepatic lobe cyst measuring 2.8 x 2.3 x 2.1 cm. 3. 4 mm calcification in the myometrium of the left uterus likely reflecting a small fibroid. 4. 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 Electronically Signed   By: Tollie Ethavid  Kwon M.D.   On: 04/01/2017 18:20    ____________________________________________   PROCEDURES  Procedure(s)  performed:   Procedures   ____________________________________________   INITIAL IMPRESSION / ASSESSMENT AND PLAN / ED COURSE  Pertinent labs & imaging results that were available during my care of the patient were reviewed by me and considered in my medical decision making (see chart for details).  I suspect patient had a urinary tract infection for a couple weeks which order immune system that she has shingles now.  We will treat for both.  Patient stable for discharge.  No indication for laboratory evaluation at this time.   ____________________________________________  FINAL CLINICAL IMPRESSION(S) / ED DIAGNOSES  Final diagnoses:  Herpes zoster without complication  Urinary tract infection with hematuria, site unspecified     MEDICATIONS GIVEN DURING THIS VISIT:  Medications  HYDROcodone-acetaminophen (NORCO/VICODIN) 5-325 MG per tablet 1 tablet (not administered)  acyclovir (ZOVIRAX) tablet 800 mg (not administered)  cephALEXin (KEFLEX) capsule 500 mg (not administered)     NEW OUTPATIENT MEDICATIONS STARTED DURING THIS VISIT:  This SmartLink is deprecated. Use AVSMEDLIST instead to display the medication list for a patient.  Note:  This document was prepared using Conservation officer, historic buildingsDragon voice recognition software and may  include unintentional dictation errors.   Marily MemosMesner, Aleisha Paone, MD 04/01/17 2134

## 2017-04-18 ENCOUNTER — Ambulatory Visit: Payer: Self-pay | Admitting: Family Medicine

## 2017-05-27 IMAGING — US US MISC SOFT TISSUE
1 series · 6 of 6 positions shown · non-contrast
Comparison: None

CLINICAL DATA: Soft tissue mass at LEFT antecubital fossa for 3
weeks

EXAM:
SOFT TISSUE ULTRASOUND - MISCELLANEOUS
TECHNIQUE: Sonography of the site of clinical concern at the medial aspect of
the LEFT antecubital fossa was performed.

[Series 1: us misc soft tissue · 0.04mm/px · 6 acquisitions, 6 frames shown]
[im 1/6]
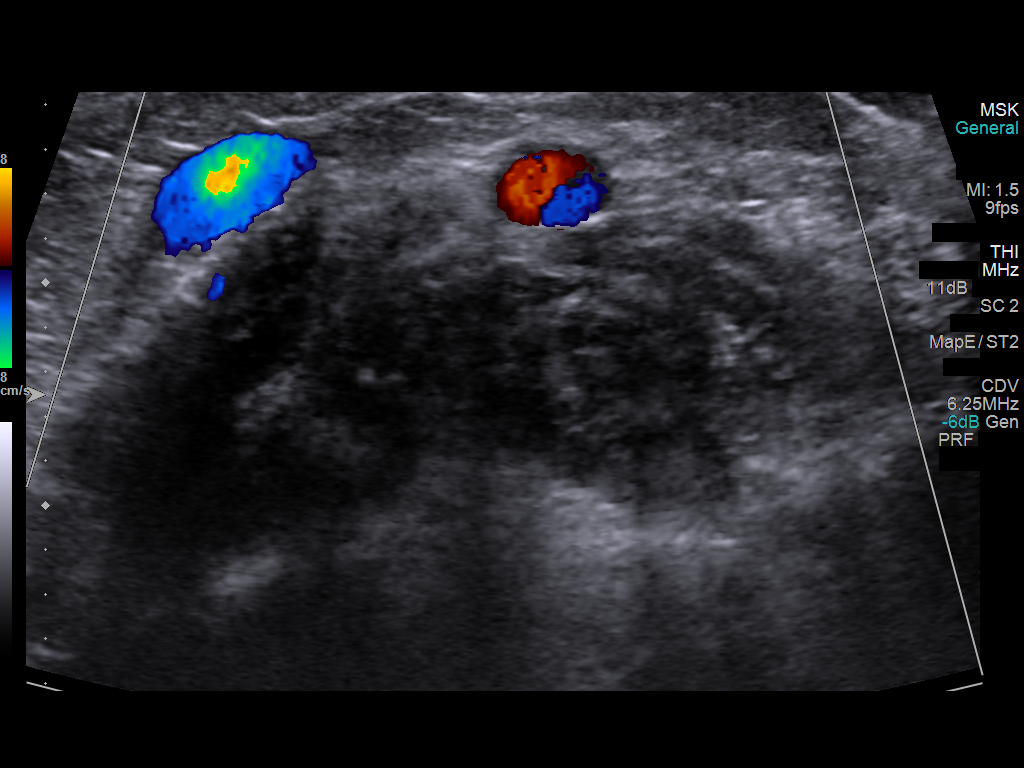
[im 2/6]
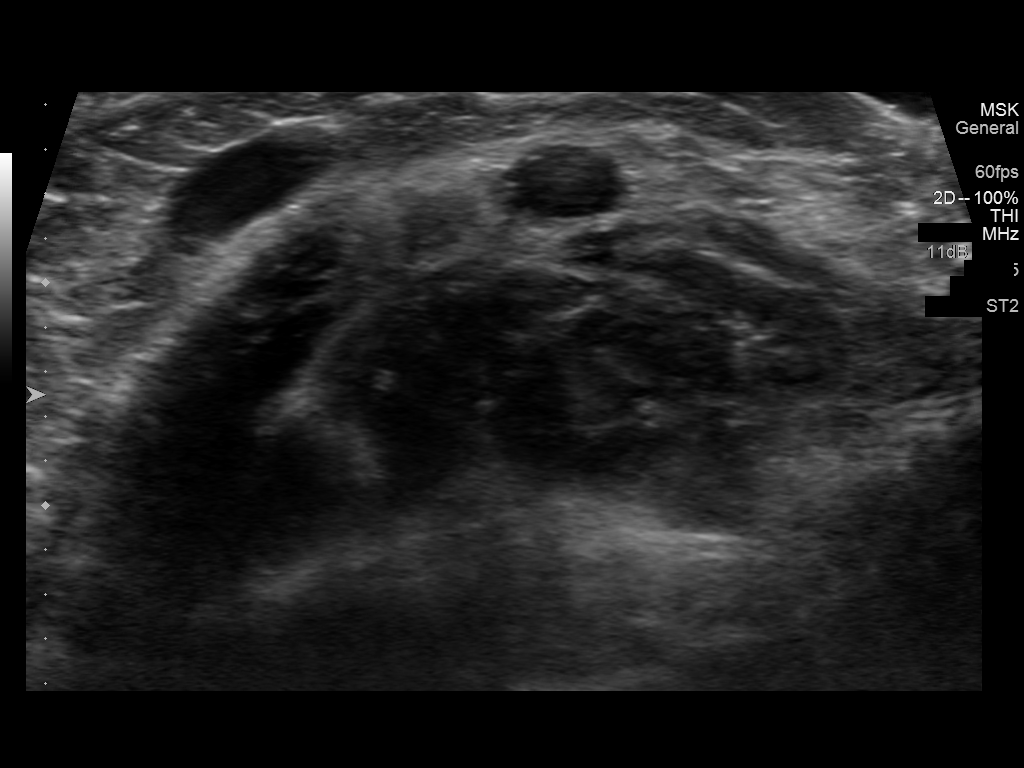
[im 3/6]
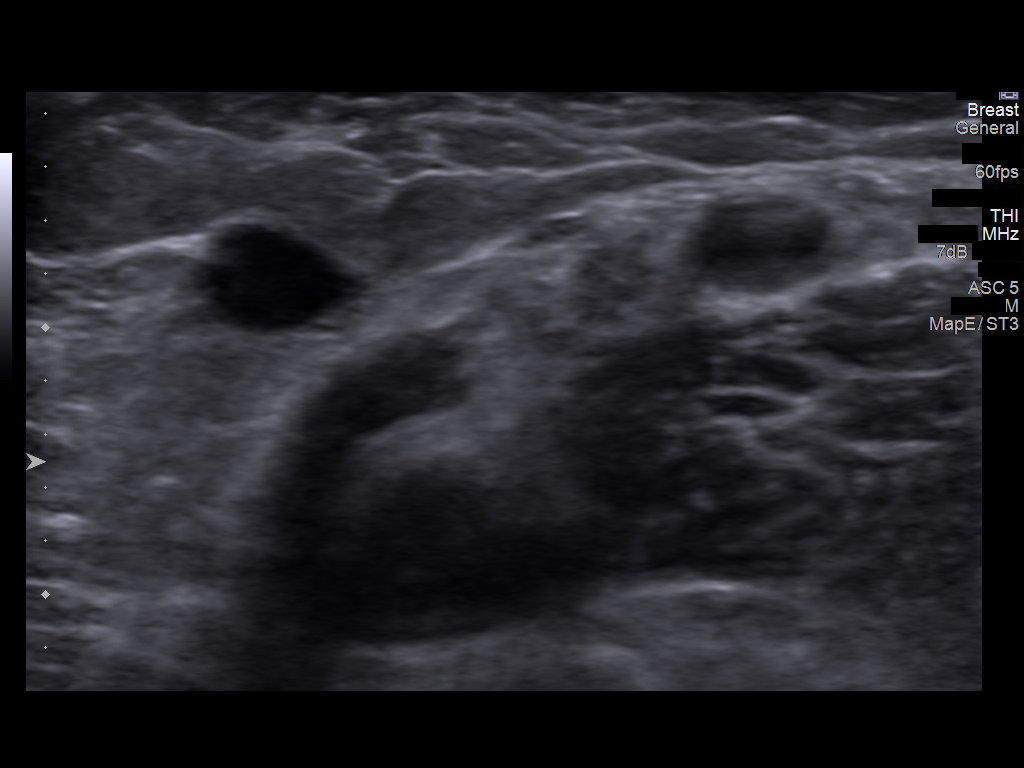
[im 4/6]
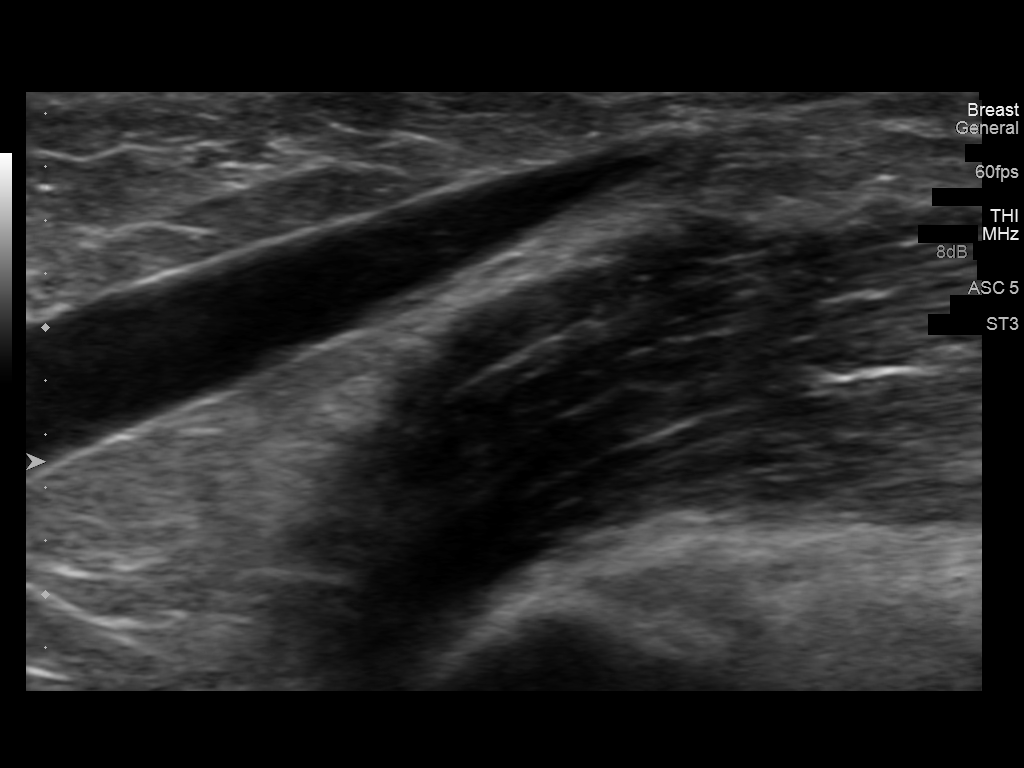
[im 5/6]
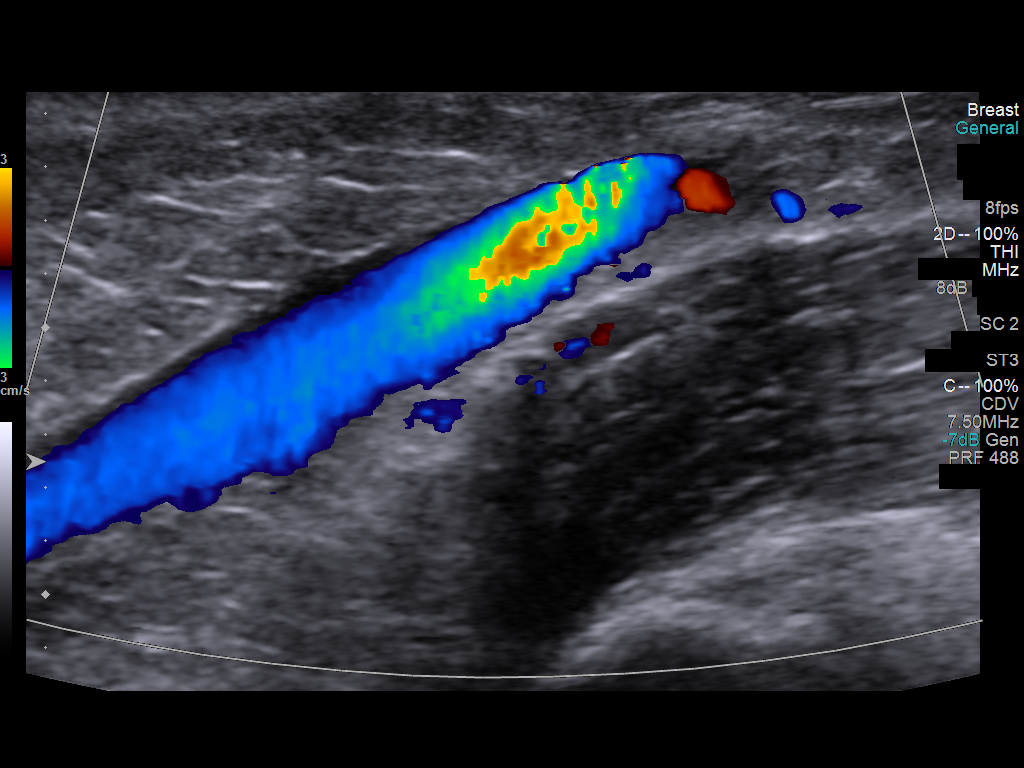
[im 6/6]
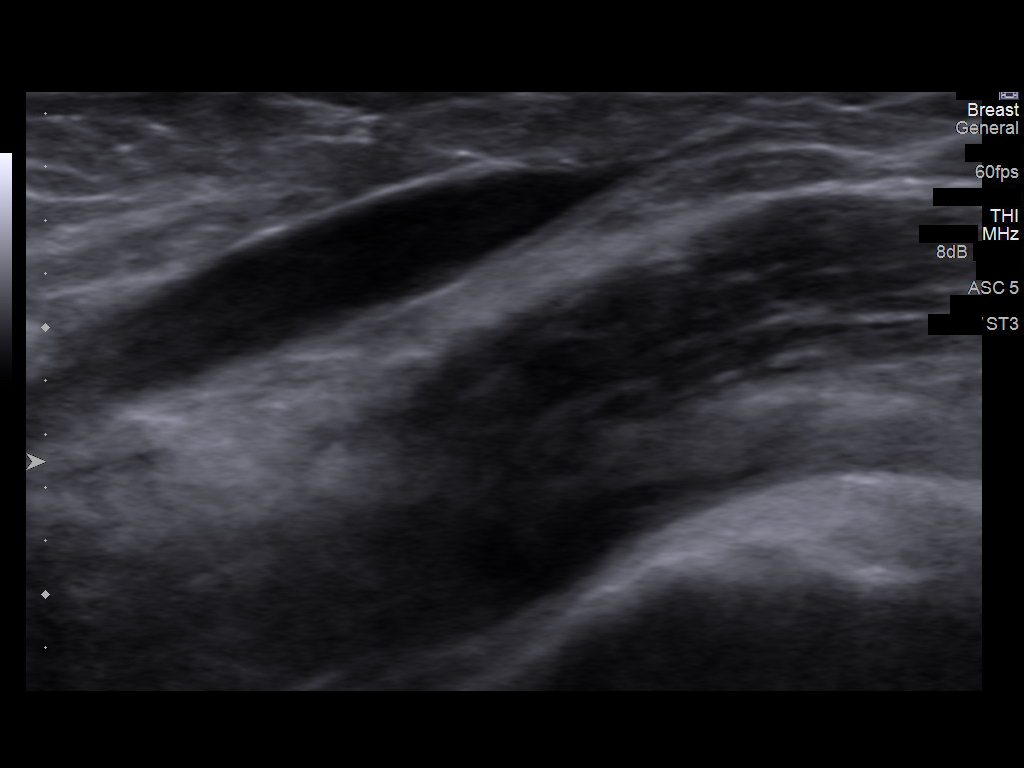

[6 of 6 positions shown; findings below may reference images not displayed]

FINDINGS: At the site of clinical concern, no focal sonographic abnormalities
are identified.

Underlying subcutaneous and muscular structures are sonographically
normal in appearance.

Observed venous structures appear patent and compressible with
spontaneous flow.

Specifically no evidence of solid or cystic mass identified.
IMPRESSION: Negative ultrasound of the LEFT antecubital fossa.

## 2017-06-20 ENCOUNTER — Ambulatory Visit: Payer: Self-pay | Admitting: Family Medicine

## 2017-06-21 ENCOUNTER — Encounter: Payer: Self-pay | Admitting: Family Medicine

## 2017-06-21 ENCOUNTER — Ambulatory Visit (INDEPENDENT_AMBULATORY_CARE_PROVIDER_SITE_OTHER): Payer: Self-pay | Admitting: Family Medicine

## 2017-06-21 VITALS — BP 102/70 | HR 74 | Temp 97.9°F | Resp 16 | Ht 62.0 in | Wt 153.0 lb

## 2017-06-21 DIAGNOSIS — F3342 Major depressive disorder, recurrent, in full remission: Secondary | ICD-10-CM

## 2017-06-21 DIAGNOSIS — F411 Generalized anxiety disorder: Secondary | ICD-10-CM

## 2017-06-21 NOTE — Progress Notes (Signed)
OFFICE VISIT  06/21/2017   CC:  Chief Complaint  Patient presents with  . Follow-up    RCI, pt is not fasting.    HPI:    Patient is a 62 y.o. Caucasian female who presents for 6 mo f/u hx of MDD and GAD. Citalopram 40mg  qd. Alprazolam 2mg  qid prn.  Last f/u she had switched herself back to citalopram from duloxetine (she felt like dulox was making her feel more depressed). She felt signif improvement with doing this switch, and we did not change any meds/dosing last visit. Stable on xanax at that time as well--No changes made. She is still doing good: no signif depression or anxiety lately.    Back in Nov 2018 she went to ED and was dx'd with shingles: right abd area.  Completely healed now and NO pain in the location now.  Past Medical History:  Diagnosis Date  . Anxiety   . Depression     History reviewed. No pertinent surgical history.  Outpatient Medications Prior to Visit  Medication Sig Dispense Refill  . alprazolam (XANAX) 2 MG tablet TAKE ONE TABLET BY MOUTH FOUR TIMES DAILY AS NEEDED FOR ANXIETY 120 tablet 5  . celecoxib (CELEBREX) 200 MG capsule TAKE ONE CAPSULE BY MOUTH EVERY EVENING AS NEEDED FOR KNEE PAIN. 30 capsule 3  . citalopram (CELEXA) 40 MG tablet Take 1 tablet (40 mg total) by mouth daily. 30 tablet 12  . ibuprofen (ADVIL,MOTRIN) 400 MG tablet Take 1 tablet (400 mg total) every 6 (six) hours as needed by mouth for moderate pain. 30 tablet 0  . cephALEXin (KEFLEX) 500 MG capsule Take 1 capsule (500 mg total) 4 (four) times daily by mouth. (Patient not taking: Reported on 06/21/2017) 28 capsule 0  . HYDROcodone-acetaminophen (NORCO/VICODIN) 5-325 MG tablet Take 0.5-1 tablets every 6 (six) hours as needed by mouth for severe pain. (Patient not taking: Reported on 06/21/2017) 10 tablet 0   No facility-administered medications prior to visit.     No Known Allergies  ROS As per HPI  PE: Blood pressure 102/70, pulse 74, temperature 97.9 F (36.6 C),  temperature source Oral, resp. rate 16, height 5\' 2"  (1.575 m), weight 153 lb (69.4 kg), SpO2 100 %. Gen: Alert, well appearing.  Patient is oriented to person, place, time, and situation. AFFECT: pleasant, lucid thought and speech. CV: RRR, no m/r/g.   LUNGS: CTA bilat, nonlabored resps, good aeration in all lung fields. EXT: no clubbing, cyanosis, or edema.    LABS:    Chemistry   No results found for: NA, K, CL, CO2, BUN, CREATININE, GLU No results found for: CALCIUM, ALKPHOS, AST, ALT, BILITOT     IMPRESSION AND PLAN:  1) MDD in remission and GAD well controlled. The current medical regimen is effective;  continue present plan and medications. No RF's need/given today.  2) Hx of herpes zoster (03/2017)--resolved appropriately, no post-herpetic neuralgia.  An After Visit Summary was printed and given to the patient.  FOLLOW UP: Return in about 6 months (around 12/19/2017).  Signed:  Santiago BumpersPhil Bronc Brosseau, MD           06/21/2017

## 2017-07-22 ENCOUNTER — Other Ambulatory Visit: Payer: Self-pay | Admitting: Family Medicine

## 2017-09-12 ENCOUNTER — Other Ambulatory Visit: Payer: Self-pay | Admitting: Family Medicine

## 2017-09-12 NOTE — Telephone Encounter (Signed)
Rx faxed

## 2017-09-12 NOTE — Telephone Encounter (Signed)
[facility]  RF request for alprazolam LOV: 06/21/17 Next ov: None Last written: 02/21/17 #120 w/ 5RF  Please advise. Thanks.

## 2018-02-13 ENCOUNTER — Other Ambulatory Visit: Payer: Self-pay | Admitting: Family Medicine

## 2018-02-13 NOTE — Telephone Encounter (Signed)
RF request for alprazolam LOV: 06/21/17 Next ov: None Last written: 09/12/17 #120 w/ 5RF  Please advise. Thanks.

## 2018-04-18 ENCOUNTER — Other Ambulatory Visit: Payer: Self-pay | Admitting: Family Medicine

## 2018-04-18 NOTE — Telephone Encounter (Signed)
Rx RF'd x 6 mo. Pls tell patient that I have to see her for office visit no later than April 2020 in order to be able to continue prescribing her medications.-thx

## 2018-04-18 NOTE — Telephone Encounter (Signed)
Detailed message left on voicemail.

## 2018-06-18 ENCOUNTER — Ambulatory Visit: Payer: Self-pay | Admitting: Family Medicine

## 2018-06-20 ENCOUNTER — Ambulatory Visit: Payer: Self-pay | Admitting: Family Medicine

## 2018-06-20 ENCOUNTER — Encounter: Payer: Self-pay | Admitting: Family Medicine

## 2018-06-20 ENCOUNTER — Encounter: Payer: Self-pay | Admitting: *Deleted

## 2018-06-20 VITALS — BP 117/78 | HR 87 | Temp 98.8°F | Resp 16 | Ht 62.0 in | Wt 154.0 lb

## 2018-06-20 DIAGNOSIS — E663 Overweight: Secondary | ICD-10-CM

## 2018-06-20 DIAGNOSIS — F411 Generalized anxiety disorder: Secondary | ICD-10-CM

## 2018-06-20 MED ORDER — MIRTAZAPINE 15 MG PO TABS: ORAL_TABLET | ORAL | 2 refills | Status: DC

## 2018-06-20 NOTE — Progress Notes (Signed)
OFFICE VISIT  06/20/2018   CC:  Chief Complaint  Patient presents with  . Follow-up    RCI   . Anxiety    Two sons still living at home and on drugs. This is causing her to have anxiety.    HPI:    Patient is a 63 y.o. Caucasian female who presents for f/u GAD. I last saw her 1 yr ago, no changes were made at that time. Has baseline chronic worry that is fairly well controlled, but acute stressors over the past year mainly center around the fact that 2 of her sons are living at her home now and ar both on drugs.  She does not want to come off citalopram 40 mg qd, but asks if she can go up on the dose some. Sleep has not been good, appetite is not good-->she drinks colas a lot. Mood is not significantly depressed.    Past Medical History:  Diagnosis Date  . Anxiety   . Depression   . Herpes zoster 03/2017   R side of abdomen    No past surgical history on file.  Outpatient Medications Prior to Visit  Medication Sig Dispense Refill  . alprazolam (XANAX) 2 MG tablet TAKE ONE TABLET BY MOUTH FOUR TIMES DAILY AS NEEDED FOR ANXIETY 120 tablet 5  . celecoxib (CELEBREX) 200 MG capsule TAKE ONE CAPSULE BY MOUTH EVERY EVENING AS NEEDED FOR KNEE PAIN. 30 capsule 3  . citalopram (CELEXA) 40 MG tablet TAKE ONE TABLET BY MOUTH DAILY. 30 tablet 5  . ibuprofen (ADVIL,MOTRIN) 400 MG tablet Take 1 tablet (400 mg total) every 6 (six) hours as needed by mouth for moderate pain. 30 tablet 0   No facility-administered medications prior to visit.     No Known Allergies  ROS As per HPI  PE: Blood pressure 117/78, pulse 87, temperature 98.8 F (37.1 C), temperature source Oral, resp. rate 16, height 5\' 2"  (1.575 m), weight 154 lb (69.9 kg), SpO2 98 %. Wt Readings from Last 2 Encounters:  06/20/18 154 lb (69.9 kg)  06/21/17 153 lb (69.4 kg)    Gen: alert, oriented x 4, affect pleasant.  Lucid thinking and conversation noted. HEENT: PERRLA, EOMI.   Neck: no LAD, mass, or  thyromegaly. CV: RRR, no m/r/g LUNGS: CTA bilat, nonlabored. NEURO: no tremor or tics noted on observation.  Coordination intact. CN 2-12 grossly intact bilaterally, strength 5/5 in all extremeties.  No ataxia.   LABS:  none  IMPRESSION AND PLAN:  GAD: fairly stable, but we discussed adding a low dose of another antidepressant to augment her citalopram. No rx for citalopram or alprazolam needed today. Add mirtazapine 1/2 of 15mg  tab qhs.  An After Visit Summary was printed and given to the patient.  FOLLOW UP: Return in about 4 months (around 10/19/2018) for f/u anx/dep.  Signed:  Santiago Bumpers, MD           06/20/2018

## 2018-08-20 ENCOUNTER — Other Ambulatory Visit: Payer: Self-pay | Admitting: Family Medicine

## 2018-08-20 NOTE — Telephone Encounter (Signed)
RF request for Alprazolam LOV: 06/20/18 Next ov: none Last written: 02/14/18 #120 w/ 5RF. Pt takes QID prn.   CSC updated 06/20/18 but no UDS. Please advise on refill, thanks. Med pending

## 2018-10-22 ENCOUNTER — Other Ambulatory Visit: Payer: Self-pay | Admitting: Family Medicine

## 2018-11-10 ENCOUNTER — Ambulatory Visit (INDEPENDENT_AMBULATORY_CARE_PROVIDER_SITE_OTHER): Payer: Medicare HMO | Admitting: Family Medicine

## 2018-11-10 ENCOUNTER — Other Ambulatory Visit: Payer: Self-pay

## 2018-11-10 ENCOUNTER — Encounter: Payer: Self-pay | Admitting: Family Medicine

## 2018-11-10 VITALS — BP 108/63 | HR 86 | Resp 16 | Wt 163.0 lb

## 2018-11-10 DIAGNOSIS — Z8659 Personal history of other mental and behavioral disorders: Secondary | ICD-10-CM | POA: Diagnosis not present

## 2018-11-10 DIAGNOSIS — M159 Polyosteoarthritis, unspecified: Secondary | ICD-10-CM

## 2018-11-10 DIAGNOSIS — J309 Allergic rhinitis, unspecified: Secondary | ICD-10-CM | POA: Diagnosis not present

## 2018-11-10 DIAGNOSIS — R69 Illness, unspecified: Secondary | ICD-10-CM | POA: Diagnosis not present

## 2018-11-10 DIAGNOSIS — F411 Generalized anxiety disorder: Secondary | ICD-10-CM | POA: Diagnosis not present

## 2018-11-10 DIAGNOSIS — Z79899 Other long term (current) drug therapy: Secondary | ICD-10-CM

## 2018-11-10 MED ORDER — FLUTICASONE PROPIONATE 50 MCG/ACT NA SUSP: 2.0000 | Freq: Every day | NASAL | 6 refills | Status: DC

## 2018-11-10 MED ORDER — CITALOPRAM HYDROBROMIDE 40 MG PO TABS: 40.0000 mg | ORAL_TABLET | Freq: Every day | ORAL | 0 refills | Status: DC

## 2018-11-10 NOTE — Progress Notes (Signed)
Virtual Visit via Video Note  I connected with pt on 11/10/18 at 11:30 AM EDT by a video enabled telemedicine application and verified that I am speaking with the correct person using two identifiers.  Location patient: home Location provider:work or home office Persons participating in the virtual visit: patient, provider  I discussed the limitations of evaluation and management by telemedicine and the availability of in person appointments. The patient expressed understanding and agreed to proceed.  Telemedicine visit is a necessity given the COVID-19 restrictions in place at the current time.  HPI: 63 y/o WF being seen today for f/u anxiety and depression. I last saw her 06/20/2018, at which time she was doing pretty well but we did decide to add a low dose of mirtazapine to augment her citalopram (1/2 of 15mg  tabs qhs).  Interim hx:  Reviewed PMP AWARE today, most recent alpraz rx filled 10/22/18.  No suspicious activity. She is stable, meds help a lot. No side effects. She says she mirtazapine caused her to gain too much wt so she stopped it. She thinks she is doing fine on citalopram 40mg  qd and xanax qid.  She is taking acetaminophen 1000 mg tid prn arthritis pain. Takes ibup rarely. Celebrex too expensive.  ROS: no CP, no SOB, no wheezing, no cough, no dizziness,no rashes, no melena/hematochezia.  No polyuria or polydipsia.  No myalgias or arthralgias. +"sinus headaches", nasal congestion, sneezing--taking generic afrin from [employer] store, has no other med to take.   Past Medical History:  Diagnosis Date  . Anxiety   . Depression   . Herpes zoster 03/2017   R side of abdomen    History reviewed. No pertinent surgical history.  Family History  Problem Relation Age of Onset  . Mental illness Mother   . Arthritis Mother   . Hypertension Mother   . Stroke Mother   . Diabetes Mother   . Mental illness Father   . Arthritis Father   . Hypertension Father   . Stroke  Father   . Diabetes Father     SOCIAL HX:  Social History   Socioeconomic History  . Marital status: Married    Spouse name: Not on file  . Number of children: Not on file  . Years of education: Not on file  . Highest education level: Not on file  Occupational History  . Not on file  Social Needs  . Financial resource strain: Not on file  . Food insecurity    Worry: Not on file    Inability: Not on file  . Transportation needs    Medical: Not on file    Non-medical: Not on file  Tobacco Use  . Smoking status: Never Smoker  . Smokeless tobacco: Never Used  Substance and Sexual Activity  . Alcohol use: No  . Drug use: No  . Sexual activity: Not on file  Lifestyle  . Physical activity    Days per week: Not on file    Minutes per session: Not on file  . Stress: Not on file  Relationships  . Social Herbalist on phone: Not on file    Gets together: Not on file    Attends religious service: Not on file    Active member of club or organization: Not on file    Attends meetings of clubs or organizations: Not on file    Relationship status: Not on file  Other Topics Concern  . Not on file  Social History Narrative  Married, 3 sons.   Housewife.   10th grade education.   No T/A/Ds.      Current Outpatient Medications:  .  alprazolam (XANAX) 2 MG tablet, TAKE ONE TABLET BY MOUTH FOUR TIMES DAILY AS NEEDED FOR ANXIETY, Disp: 120 tablet, Rfl: 5 .  celecoxib (CELEBREX) 200 MG capsule, TAKE ONE CAPSULE BY MOUTH EVERY EVENING AS NEEDED FOR KNEE PAIN., Disp: 30 capsule, Rfl: 3 .  citalopram (CELEXA) 40 MG tablet, Take 1 tablet (40 mg total) by mouth daily., Disp: 90 tablet, Rfl: 0 .  ibuprofen (ADVIL,MOTRIN) 400 MG tablet, Take 1 tablet (400 mg total) every 6 (six) hours as needed by mouth for moderate pain., Disp: 30 tablet, Rfl: 0 .  fluticasone (FLONASE) 50 MCG/ACT nasal spray, Place 2 sprays into both nostrils daily., Disp: 18 g, Rfl: 6 .  mirtazapine  (REMERON) 15 MG tablet, 1/2 tab po qhs (Patient not taking: Reported on 11/10/2018), Disp: 15 tablet, Rfl: 2  EXAM:  VITALS per patient if applicable: BP 108/63 (BP Location: Left Arm, Patient Position: Sitting, Cuff Size: Normal)   Pulse 86   Resp 16   Wt 163 lb (73.9 kg)   BMI 29.81 kg/m    GENERAL: alert, oriented, appears well and in no acute distress  HEENT: atraumatic, conjunttiva clear, no obvious abnormalities on inspection of external nose and ears  NECK: normal movements of the head and neck  LUNGS: on inspection no signs of respiratory distress, breathing rate appears normal, no obvious gross SOB, gasping or wheezing  CV: no obvious cyanosis  MS: moves all visible extremities without noticeable abnormality  PSYCH/NEURO: pleasant and cooperative, no obvious depression or anxiety, speech and thought processing grossly intact  ASSESSMENT AND PLAN:  Discussed the following assessment and plan:  1) GAD, hx of depression: The current medical regimen is effective;  continue present plan and medications. Did not tolerate low dose mirtaz-->wt gain. Continue citalopram 40 mg qd and did RF for this med today. Continue xanax 2mg , 1 qid prn.  No rx was needed for this today. CSC dated 06/20/18.  Will do UDS at next f/u in 6 mo.   2) Allergic rhinitis with peri-orbital HAs:  Start flonase qd. I warned her against overuse of afrin. Take tylenol 1000 mg tid prn HAs.  3) Diffuse osteoarthritic pain: doing fine on tylenol. Rarely takes ibuprofen. No celebrex b/c too expensive.  I discussed the assessment and treatment plan with the patient. The patient was provided an opportunity to ask questions and all were answered. The patient agreed with the plan and demonstrated an understanding of the instructions.   The patient was advised to call back or seek an in-person evaluation if the symptoms worsen or if the condition fails to improve as anticipated.  F/u: 6 mo CPE + fasting  labs.  Signed:  Santiago BumpersPhil Jawanna Dykman, MD           11/10/2018

## 2018-11-13 IMAGING — CT CT RENAL STONE PROTOCOL
2 of 4 series · 16 of 46 positions shown, 18 images · non-contrast
Comparison: None.

CLINICAL DATA: Right flank pain radiating to the right lower
quadrant x1 week.

EXAM:
CT ABDOMEN AND PELVIS WITHOUT CONTRAST
TECHNIQUE: Multidetector CT imaging of the abdomen and pelvis was performed
following the standard protocol without IV contrast.

[Series 2: axial st · axial · 0.68mm/px · z∈[-292,+58]mm · 13 of 78 slices shown, 15 images]
[im 4/78  soft-tissue]
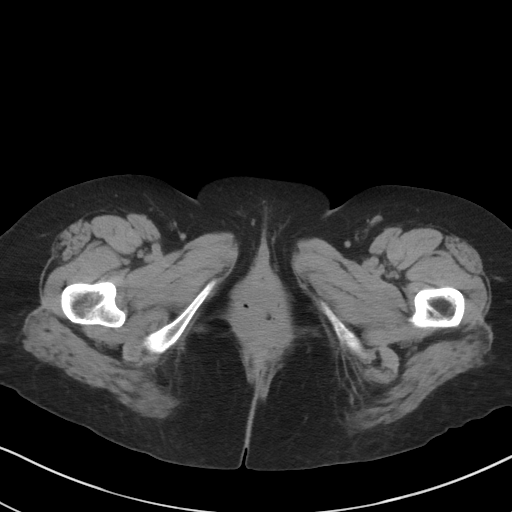
[im 4/78  bone]
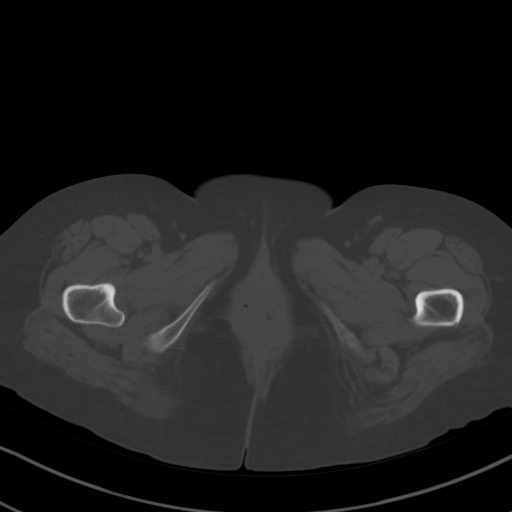
[im 10/78  soft-tissue]
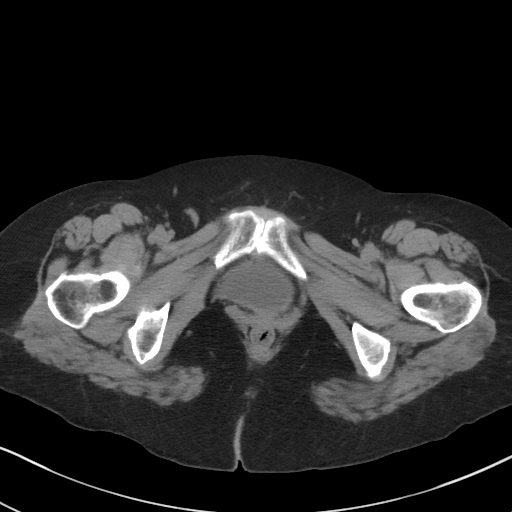
[im 17/78  soft-tissue]
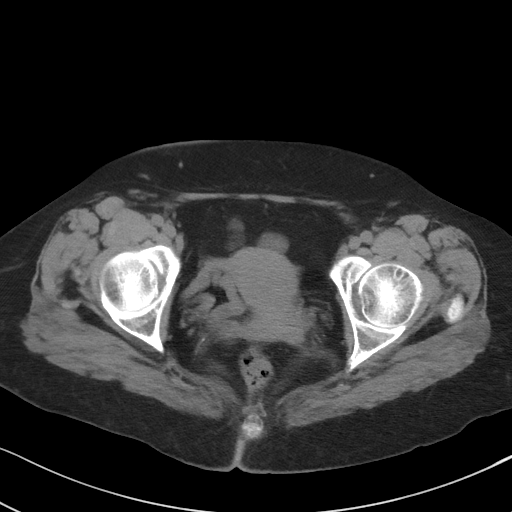
[im 23/78  soft-tissue]
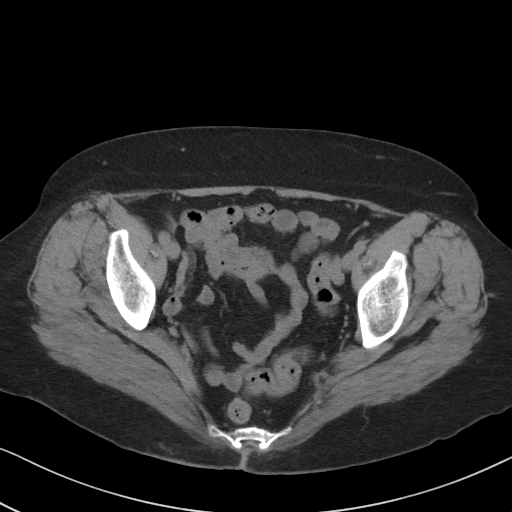
[im 26/78  soft-tissue]
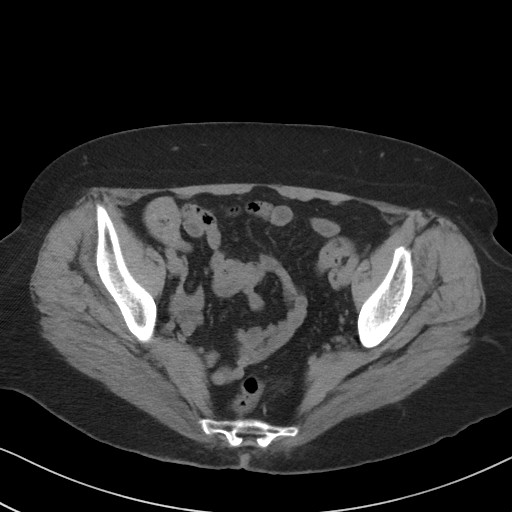
[im 33/78  soft-tissue]
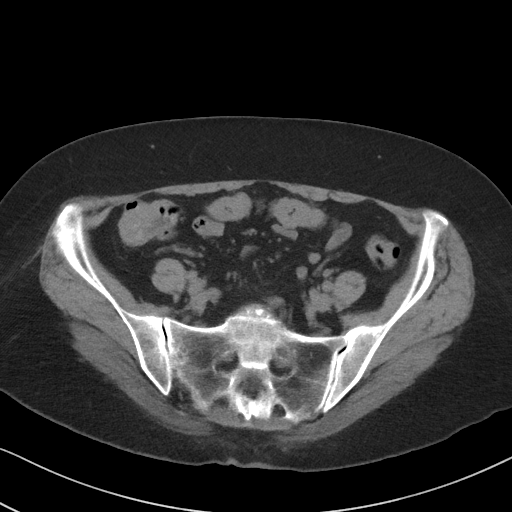
[im 39/78  soft-tissue]
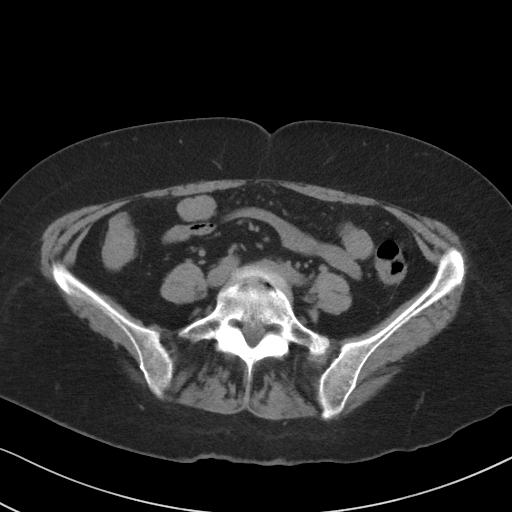
[im 45/78  soft-tissue]
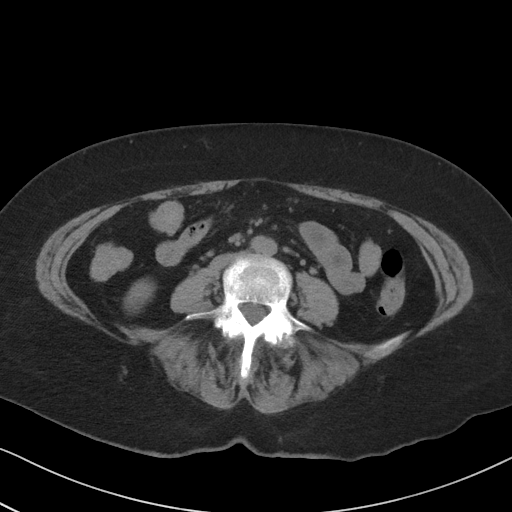
[im 52/78  soft-tissue]
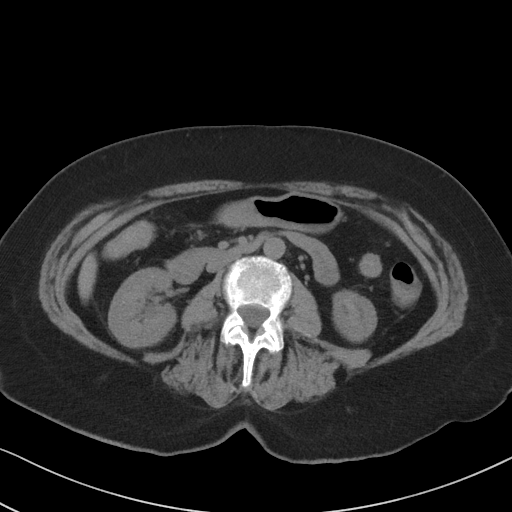
[im 52/78  bone]
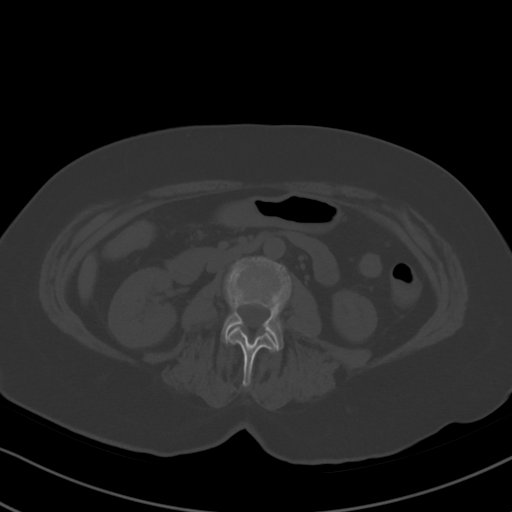
[im 55/78  soft-tissue]
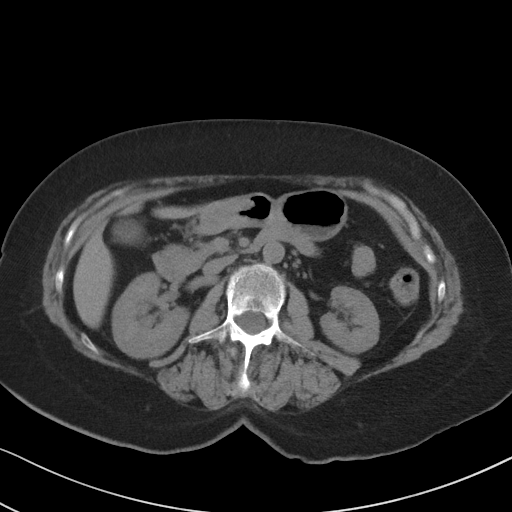
[im 61/78  soft-tissue]
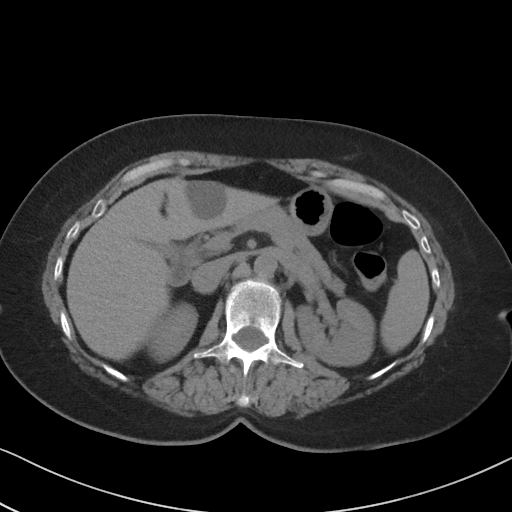
[im 68/78  soft-tissue]
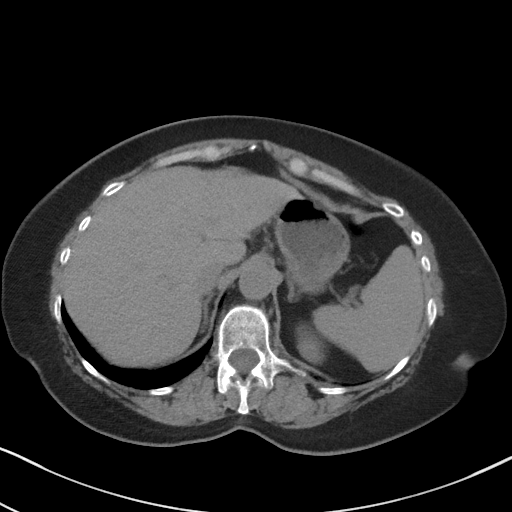
[im 74/78  soft-tissue]
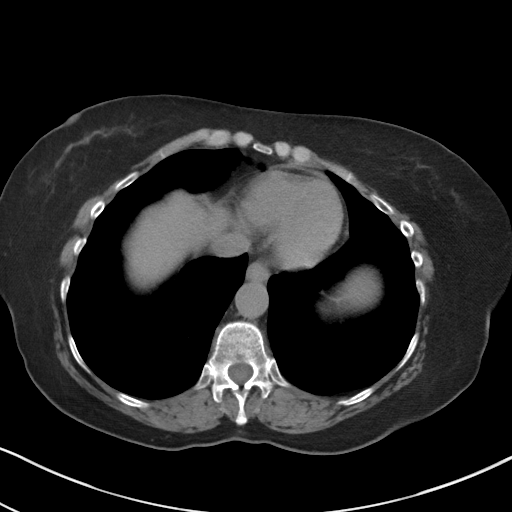

[Series 5: coronal st · coronal · 0.63mm/px · 3 of 80 slices shown]
[im 27/80  soft-tissue]
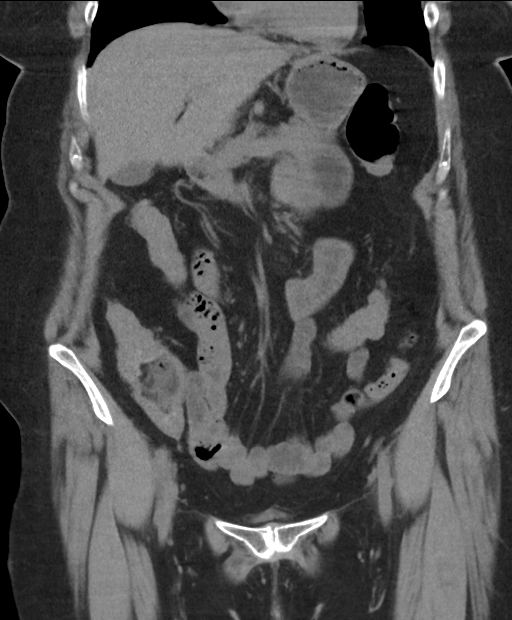
[im 36/80  soft-tissue]
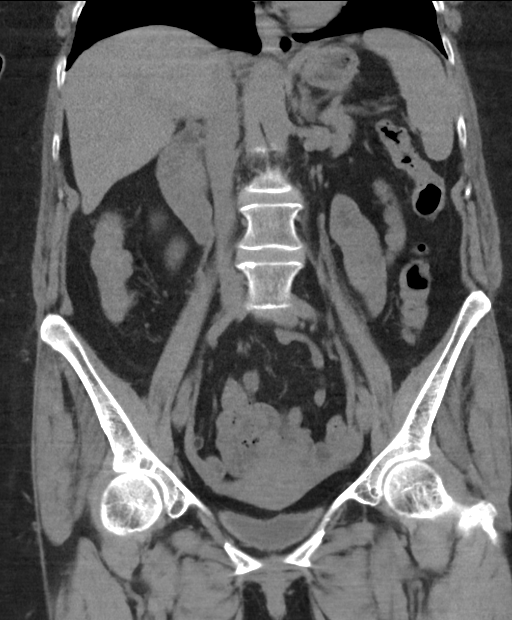
[im 44/80  soft-tissue]
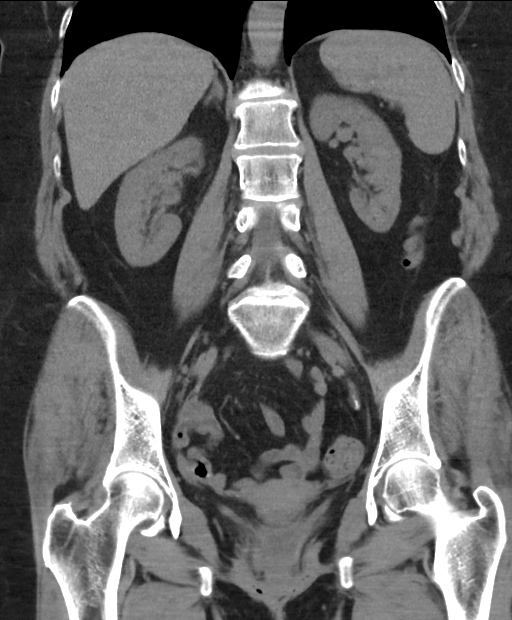

[16 of 46 positions shown; findings below may reference images not displayed]

FINDINGS: Lower chest: No acute abnormality.

Hepatobiliary: 2.8 x 2.3 x 2.1 cm left hepatic cyst. No biliary
dilatation. Unremarkable gallbladder.

Pancreas: Unremarkable. No pancreatic ductal dilatation or
surrounding inflammatory changes.

Spleen: Normal in size without focal abnormality.

Adrenals/Urinary Tract: Adrenal glands are unremarkable. Kidneys are
normal, without renal calculi, focal lesion, or hydronephrosis.
Bladder is unremarkable.

Stomach/Bowel: Stomach is within normal limits. Appendix is normal
in caliber without inflammation. No evidence of bowel wall
thickening, distention, or inflammatory changes.

Vascular/Lymphatic: No significant vascular findings are present. No
enlarged abdominal or pelvic lymph nodes. Calcified lymph node in
the left hemipelvis.

Reproductive: 4 mm calcification within the myometrium of the left
uterus is consistent with a calcified uterine fibroid. No adnexal
mass.

Other: Tiny fat containing umbilical hernia. No abdominopelvic
ascites.

Musculoskeletal: Vacuum disc phenomenon with disc flattening L4-5.
Moderate-to-marked disc space narrowing at T12-L1. No acute osseous
abnormality.
IMPRESSION: 1. No nephrolithiasis nor obstructive uropathy.
2. Simple appearing left hepatic lobe cyst measuring 2.8 x 2.3 x
cm.
3. 4 mm calcification in the myometrium of the left uterus likely
reflecting a small fibroid.
4. Degenerative disc disease T12-L1 and L4-5.

## 2019-01-12 ENCOUNTER — Telehealth: Payer: Self-pay | Admitting: Family Medicine

## 2019-01-12 NOTE — Telephone Encounter (Signed)
Called patient and told her I was unsure if the blood tests at [facility] are accurate. Advised that she can contact the [facility] if she has given blood in the past, hospital if she has had major sugery or when she delivered her children. Patient verbalized understanding.

## 2019-01-12 NOTE — Telephone Encounter (Signed)
Patient would like to know her and her husband blood type is. Please call her

## 2019-01-12 NOTE — Telephone Encounter (Signed)
Patient would like to know if the kits to test your blood type that you buy at [facility] are accurate.

## 2019-02-25 ENCOUNTER — Other Ambulatory Visit: Payer: Self-pay | Admitting: Family Medicine

## 2019-02-25 NOTE — Telephone Encounter (Signed)
Requesting: alprazolam Contract:06/20/18 WYB:RKVT Last Visit:11/10/18 Next Visit:05/22/19 Last Refill:08/20/18(120,5)  Please Advise. Medication pending

## 2019-03-18 ENCOUNTER — Other Ambulatory Visit: Payer: Self-pay | Admitting: Family Medicine

## 2019-03-18 NOTE — Telephone Encounter (Signed)
Refill sent in, pt was notified.

## 2019-03-18 NOTE — Telephone Encounter (Signed)
Patient is calling in for refill of her "depression med". She wasn't sure of the name of the pill.

## 2019-05-21 ENCOUNTER — Other Ambulatory Visit: Payer: Self-pay | Admitting: Family Medicine

## 2019-05-22 ENCOUNTER — Encounter: Payer: Medicare HMO | Admitting: Family Medicine

## 2019-06-20 ENCOUNTER — Other Ambulatory Visit: Payer: Self-pay | Admitting: Family Medicine

## 2019-06-26 ENCOUNTER — Encounter: Payer: Self-pay | Admitting: Family Medicine

## 2019-06-26 ENCOUNTER — Other Ambulatory Visit: Payer: Self-pay

## 2019-06-26 ENCOUNTER — Ambulatory Visit (INDEPENDENT_AMBULATORY_CARE_PROVIDER_SITE_OTHER): Payer: Medicare HMO | Admitting: Family Medicine

## 2019-06-26 VITALS — BP 105/73 | HR 82 | Temp 99.8°F | Resp 16 | Ht 62.0 in | Wt 145.0 lb

## 2019-06-26 DIAGNOSIS — Z79899 Other long term (current) drug therapy: Secondary | ICD-10-CM

## 2019-06-26 DIAGNOSIS — Z1211 Encounter for screening for malignant neoplasm of colon: Secondary | ICD-10-CM

## 2019-06-26 DIAGNOSIS — F411 Generalized anxiety disorder: Secondary | ICD-10-CM | POA: Diagnosis not present

## 2019-06-26 DIAGNOSIS — Z23 Encounter for immunization: Secondary | ICD-10-CM

## 2019-06-26 DIAGNOSIS — Z0000 Encounter for general adult medical examination without abnormal findings: Secondary | ICD-10-CM | POA: Diagnosis not present

## 2019-06-26 DIAGNOSIS — R69 Illness, unspecified: Secondary | ICD-10-CM | POA: Diagnosis not present

## 2019-06-26 MED ORDER — ZOSTER VAC RECOMB ADJUVANTED 50 MCG/0.5ML IM SUSR: 0.5000 mL | Freq: Once | INTRAMUSCULAR | 0 refills | Status: AC

## 2019-06-26 MED ORDER — CITALOPRAM HYDROBROMIDE 40 MG PO TABS: 40.0000 mg | ORAL_TABLET | Freq: Every day | ORAL | 3 refills | Status: DC

## 2019-06-26 NOTE — Addendum Note (Signed)
Addended by: Emi Holes D on: 06/26/2019 03:32 PM   Modules accepted: Orders

## 2019-06-26 NOTE — Patient Instructions (Signed)
Health Maintenance, Female Adopting a healthy lifestyle and getting preventive care are important in promoting health and wellness. Ask your health care provider about:  The right schedule for you to have regular tests and exams.  Things you can do on your own to prevent diseases and keep yourself healthy. What should I know about diet, weight, and exercise? Eat a healthy diet   Eat a diet that includes plenty of vegetables, fruits, low-fat dairy products, and lean protein.  Do not eat a lot of foods that are high in solid fats, added sugars, or sodium. Maintain a healthy weight Body mass index (BMI) is used to identify weight problems. It estimates body fat based on height and weight. Your health care provider can help determine your BMI and help you achieve or maintain a healthy weight. Get regular exercise Get regular exercise. This is one of the most important things you can do for your health. Most adults should:  Exercise for at least 150 minutes each week. The exercise should increase your heart rate and make you sweat (moderate-intensity exercise).  Do strengthening exercises at least twice a week. This is in addition to the moderate-intensity exercise.  Spend less time sitting. Even light physical activity can be beneficial. Watch cholesterol and blood lipids Have your blood tested for lipids and cholesterol at 66 years of age, then have this test every 5 years. Have your cholesterol levels checked more often if:  Your lipid or cholesterol levels are high.  You are older than 66 years of age.  You are at high risk for heart disease. What should I know about cancer screening? Depending on your health history and family history, you may need to have cancer screening at various ages. This may include screening for:  Breast cancer.  Cervical cancer.  Colorectal cancer.  Skin cancer.  Lung cancer. What should I know about heart disease, diabetes, and high blood  pressure? Blood pressure and heart disease  High blood pressure causes heart disease and increases the risk of stroke. This is more likely to develop in people who have high blood pressure readings, are of African descent, or are overweight.  Have your blood pressure checked: ? Every 3-5 years if you are 18-39 years of age. ? Every year if you are 40 years old or older. Diabetes Have regular diabetes screenings. This checks your fasting blood sugar level. Have the screening done:  Once every three years after age 40 if you are at a normal weight and have a low risk for diabetes.  More often and at a younger age if you are overweight or have a high risk for diabetes. What should I know about preventing infection? Hepatitis B If you have a higher risk for hepatitis B, you should be screened for this virus. Talk with your health care provider to find out if you are at risk for hepatitis B infection. Hepatitis C Testing is recommended for:  Everyone born from 1945 through 1965.  Anyone with known risk factors for hepatitis C. Sexually transmitted infections (STIs)  Get screened for STIs, including gonorrhea and chlamydia, if: ? You are sexually active and are younger than 66 years of age. ? You are older than 66 years of age and your health care provider tells you that you are at risk for this type of infection. ? Your sexual activity has changed since you were last screened, and you are at increased risk for chlamydia or gonorrhea. Ask your health care provider if   you are at risk.  Ask your health care provider about whether you are at high risk for HIV. Your health care provider may recommend a prescription medicine to help prevent HIV infection. If you choose to take medicine to prevent HIV, you should first get tested for HIV. You should then be tested every 3 months for as long as you are taking the medicine. Pregnancy  If you are about to stop having your period (premenopausal) and  you may become pregnant, seek counseling before you get pregnant.  Take 400 to 800 micrograms (mcg) of folic acid every day if you become pregnant.  Ask for birth control (contraception) if you want to prevent pregnancy. Osteoporosis and menopause Osteoporosis is a disease in which the bones lose minerals and strength with aging. This can result in bone fractures. If you are 65 years old or older, or if you are at risk for osteoporosis and fractures, ask your health care provider if you should:  Be screened for bone loss.  Take a calcium or vitamin D supplement to lower your risk of fractures.  Be given hormone replacement therapy (HRT) to treat symptoms of menopause. Follow these instructions at home: Lifestyle  Do not use any products that contain nicotine or tobacco, such as cigarettes, e-cigarettes, and chewing tobacco. If you need help quitting, ask your health care provider.  Do not use street drugs.  Do not share needles.  Ask your health care provider for help if you need support or information about quitting drugs. Alcohol use  Do not drink alcohol if: ? Your health care provider tells you not to drink. ? You are pregnant, may be pregnant, or are planning to become pregnant.  If you drink alcohol: ? Limit how much you use to 0-1 drink a day. ? Limit intake if you are breastfeeding.  Be aware of how much alcohol is in your drink. In the [place] one drink equals one 12 oz bottle of beer (355 mL), one 5 oz glass of wine (148 mL), or one 1 oz glass of hard liquor (44 mL). General instructions  Schedule regular health, dental, and eye exams.  Stay current with your vaccines.  Tell your health care provider if: ? You often feel depressed. ? You have ever been abused or do not feel safe at home. Summary  Adopting a healthy lifestyle and getting preventive care are important in promoting health and wellness.  Follow your health care provider's instructions about healthy  diet, exercising, and getting tested or screened for diseases.  Follow your health care provider's instructions on monitoring your cholesterol and blood pressure. This information is not intended to replace advice given to you by your health care provider. Make sure you discuss any questions you have with your health care provider. Document Revised: 04/23/2018 Document Reviewed: 04/23/2018 Elsevier Patient Education  2020 Elsevier Inc.  

## 2019-06-26 NOTE — Progress Notes (Signed)
Office Note 06/26/2019  CC:  Chief Complaint  Patient presents with  . Annual Exam    pt is fasting   HPI:  Diana Pittman is a 65 y.o. White female who is here for annual health maintenance exam and f/u anx/dep.  Pt has been on alprazolam and citalopram long term. Helpful, no problems. CSC 06/20/18: will update today. PMP AWARE reviewed today: most recent rx for alprazolam was filled 05/28/19, # 120, rx by me. No red flags.   She has no acute complaints. She walks some for exercise. Eats fairly healthy.  Past Medical History:  Diagnosis Date  . Anxiety   . Depression   . Herpes zoster 03/2017   R side of abdomen    No past surgical history on file.  Family History  Problem Relation Age of Onset  . Mental illness Mother   . Arthritis Mother   . Hypertension Mother   . Stroke Mother   . Diabetes Mother   . Mental illness Father   . Arthritis Father   . Hypertension Father   . Stroke Father   . Diabetes Father     Social History   Socioeconomic History  . Marital status: Married    Spouse name: Not on file  . Number of children: Not on file  . Years of education: Not on file  . Highest education level: Not on file  Occupational History  . Not on file  Tobacco Use  . Smoking status: Never Smoker  . Smokeless tobacco: Never Used  Substance and Sexual Activity  . Alcohol use: No  . Drug use: No  . Sexual activity: Not on file  Other Topics Concern  . Not on file  Social History Narrative   Married, 3 sons.   Housewife.   10th grade education.   No T/A/Ds.   Social Determinants of Health   Financial Resource Strain:   . Difficulty of Paying Living Expenses: Not on file  Food Insecurity:   . Worried About Programme researcher, broadcasting/film/video in the Last Year: Not on file  . Ran Out of Food in the Last Year: Not on file  Transportation Needs:   . Lack of Transportation (Medical): Not on file  . Lack of Transportation (Non-Medical): Not on file  Physical  Activity:   . Days of Exercise per Week: Not on file  . Minutes of Exercise per Session: Not on file  Stress:   . Feeling of Stress : Not on file  Social Connections:   . Frequency of Communication with Friends and Family: Not on file  . Frequency of Social Gatherings with Friends and Family: Not on file  . Attends Religious Services: Not on file  . Active Member of Clubs or Organizations: Not on file  . Attends Banker Meetings: Not on file  . Marital Status: Not on file  Intimate Partner Violence:   . Fear of Current or Ex-Partner: Not on file  . Emotionally Abused: Not on file  . Physically Abused: Not on file  . Sexually Abused: Not on file    Outpatient Medications Prior to Visit  Medication Sig Dispense Refill  . alprazolam (XANAX) 2 MG tablet TAKE ONE TABLET BY MOUTH FOUR TIMES DAILY AS NEEDED FOR ANXIETY 120 tablet 5  . fluticasone (FLONASE) 50 MCG/ACT nasal spray USE 2 SPRAYS IN EACH NOSTRIL ONCE DAILY.  SHAKE GENTLY BEFORE USING. 16 g 6  . ibuprofen (ADVIL,MOTRIN) 400 MG tablet Take 1 tablet (400 mg  total) every 6 (six) hours as needed by mouth for moderate pain. 30 tablet 0  . citalopram (CELEXA) 40 MG tablet TAKE ONE TABLET BY MOUTH DAILY. 30 tablet 0  . celecoxib (CELEBREX) 200 MG capsule TAKE ONE CAPSULE BY MOUTH EVERY EVENING AS NEEDED FOR KNEE PAIN. (Patient not taking: Reported on 06/26/2019) 30 capsule 3  . mirtazapine (REMERON) 15 MG tablet 1/2 tab po qhs (Patient not taking: Reported on 11/10/2018) 15 tablet 2   No facility-administered medications prior to visit.    No Known Allergies  ROS Review of Systems  Constitutional: Negative for appetite change, chills, fatigue and fever.  HENT: Negative for congestion, dental problem, ear pain and sore throat.   Eyes: Negative for discharge, redness and visual disturbance.  Respiratory: Negative for cough, chest tightness, shortness of breath and wheezing.   Cardiovascular: Negative for chest pain,  palpitations and leg swelling.  Gastrointestinal: Negative for abdominal pain, blood in stool, diarrhea, nausea and vomiting.  Genitourinary: Negative for difficulty urinating, dysuria, flank pain, frequency, hematuria and urgency.  Musculoskeletal: Negative for arthralgias, back pain, joint swelling, myalgias and neck stiffness.  Skin: Negative for pallor and rash.  Neurological: Negative for dizziness, speech difficulty, weakness and headaches.  Hematological: Negative for adenopathy. Does not bruise/bleed easily.  Psychiatric/Behavioral: Negative for confusion and sleep disturbance. The patient is not nervous/anxious.     PE; Blood pressure 105/73, pulse 82, temperature 99.8 F (37.7 C), temperature source Temporal, resp. rate 16, height 5\' 2"  (1.575 m), weight 145 lb (65.8 kg), SpO2 99 %. Body mass index is 26.52 kg/m.  Exam chaperoned by Deveron Furlong, CMA.  Gen: Alert, well appearing.  Patient is oriented to person, place, time, and situation. AFFECT: pleasant, lucid thought and speech. ENT: Ears: EACs clear, normal epithelium.  TMs with good light reflex and landmarks bilaterally.  Eyes: no injection, icteris, swelling, or exudate.  EOMI, PERRLA. Nose: no drainage or turbinate edema/swelling.  No injection or focal lesion.  Mouth: lips without lesion/swelling.  Oral mucosa pink and moist.  Dentition intact and without obvious caries or gingival swelling.  Oropharynx without erythema, exudate, or swelling.  Neck: supple/nontender.  No LAD, mass, or TM.  Carotid pulses 2+ bilaterally, without bruits. CV: RRR, no m/r/g.   LUNGS: CTA bilat, nonlabored resps, good aeration in all lung fields. ABD: soft, NT, ND, BS normal.  No hepatospenomegaly or mass.  No bruits. EXT: no clubbing, cyanosis, or edema.  Musculoskeletal: no joint swelling, erythema, warmth, or tenderness.  ROM of all joints intact. Skin - no sores or suspicious lesions or rashes or color changes   Pertinent labs:   No results found for: TSH No results found for: WBC, HGB, HCT, MCV, PLT No results found for: CREATININE, BUN, NA, K, CL, CO2 No results found for: ALT, AST, GGT, ALKPHOS, BILITOT No results found for: CHOL No results found for: HDL No results found for: LDLCALC No results found for: TRIG No results found for: CHOLHDL No results found for: HGBA1C  ASSESSMENT AND PLAN:   1) GAD, with high risk med use: The current medical regimen is effective;  continue present plan and medications. No new rx for alprazolam needed today. I did RF citalopram x 3 mo with 3 additional RFs. CSC updated today, UDS obtained today.  2) Health maintenance exam: Reviewed age and gender appropriate health maintenance issues (prudent diet, regular exercise, health risks of tobacco and excessive alcohol, use of seatbelts, fire alarms in home, use of sunscreen).  Also  reviewed age and gender appropriate health screening as well as vaccine recommendations. Vaccines: Flu->pt declines.  Pneumovax->declines.  Shingrix->she has had this. Labs: fasting HP labs ordered. Cervical ca screening: pt defers this for now and will call back if she changes her mind. Breast ca screening: pt overdue for screening mammography->she declines at this tiime. Colon ca screening: Pt has never had colon ca screening->cologuard will be arranged. Osteoporosis screening: pt declines any screening for this.  An After Visit Summary was printed and given to the patient.  FOLLOW UP:  Return in about 6 months (around 12/24/2019) for routine chronic illness f/u.  Signed:  Santiago Bumpers, MD           06/26/2019

## 2019-06-28 LAB — CBC WITH DIFFERENTIAL/PLATELET
Absolute Monocytes: 400 cells/uL (ref 200–950)
Basophils Absolute: 120 cells/uL (ref 0–200)
Basophils Relative: 2.3 %
Eosinophils Absolute: 99 cells/uL (ref 15–500)
Eosinophils Relative: 1.9 %
HCT: 39.7 % (ref 35.0–45.0)
Hemoglobin: 13.9 g/dL (ref 11.7–15.5)
Lymphs Abs: 1945 cells/uL (ref 850–3900)
MCH: 30.3 pg (ref 27.0–33.0)
MCHC: 35 g/dL (ref 32.0–36.0)
MCV: 86.7 fL (ref 80.0–100.0)
MPV: 9.8 fL (ref 7.5–12.5)
Monocytes Relative: 7.7 %
Neutro Abs: 2636 cells/uL (ref 1500–7800)
Neutrophils Relative %: 50.7 %
Platelets: 203 10*3/uL (ref 140–400)
RBC: 4.58 10*6/uL (ref 3.80–5.10)
RDW: 13 % (ref 11.0–15.0)
Total Lymphocyte: 37.4 %
WBC: 5.2 10*3/uL (ref 3.8–10.8)

## 2019-06-28 LAB — PAIN MGMT, PROFILE 8 W/CONF, U
6 Acetylmorphine: NEGATIVE ng/mL
Alcohol Metabolites: NEGATIVE ng/mL (ref <500)
Alphahydroxyalprazolam: 2490 ng/mL
Alphahydroxymidazolam: NEGATIVE ng/mL
Alphahydroxytriazolam: NEGATIVE ng/mL
Aminoclonazepam: NEGATIVE ng/mL
Amphetamines: NEGATIVE ng/mL
Benzodiazepines: POSITIVE ng/mL
Buprenorphine, Urine: NEGATIVE ng/mL
Cocaine Metabolite: NEGATIVE ng/mL
Creatinine: 108.4 mg/dL
Hydroxyethylflurazepam: NEGATIVE ng/mL
Lorazepam: NEGATIVE ng/mL
MDMA: NEGATIVE ng/mL
Marijuana Metabolite: NEGATIVE ng/mL
Nordiazepam: NEGATIVE ng/mL
Opiates: NEGATIVE ng/mL
Oxazepam: NEGATIVE ng/mL
Oxidant: NEGATIVE ug/mL
Oxycodone: NEGATIVE ng/mL
Temazepam: NEGATIVE ng/mL
pH: 6.1 (ref 4.5–9.0)

## 2019-06-28 LAB — LIPID PANEL
Cholesterol: 227 mg/dL — ABNORMAL HIGH (ref <200)
HDL: 67 mg/dL (ref >=50)
LDL Cholesterol (Calc): 136 mg/dL (calc) — ABNORMAL HIGH
Non-HDL Cholesterol (Calc): 160 mg/dL (calc) — ABNORMAL HIGH (ref <130)
Total CHOL/HDL Ratio: 3.4 (calc) (ref <5.0)
Triglycerides: 119 mg/dL (ref <150)

## 2019-06-28 LAB — COMPREHENSIVE METABOLIC PANEL
AG Ratio: 1.3 (calc) (ref 1.0–2.5)
ALT: 5 U/L — ABNORMAL LOW (ref 6–29)
AST: 16 U/L (ref 10–35)
Albumin: 4.4 g/dL (ref 3.6–5.1)
Alkaline phosphatase (APISO): 70 U/L (ref 37–153)
BUN: 9 mg/dL (ref 7–25)
CO2: 25 mmol/L (ref 20–32)
Calcium: 9.6 mg/dL (ref 8.6–10.4)
Chloride: 108 mmol/L (ref 98–110)
Creat: 0.77 mg/dL (ref 0.50–0.99)
Globulin: 3.3 g/dL (calc) (ref 1.9–3.7)
Glucose, Bld: 88 mg/dL (ref 65–99)
Potassium: 3.7 mmol/L (ref 3.5–5.3)
Sodium: 142 mmol/L (ref 135–146)
Total Bilirubin: 0.6 mg/dL (ref 0.2–1.2)
Total Protein: 7.7 g/dL (ref 6.1–8.1)

## 2019-07-21 LAB — COLOGUARD

## 2019-07-30 ENCOUNTER — Other Ambulatory Visit: Payer: Self-pay | Admitting: Family Medicine

## 2019-07-31 NOTE — Telephone Encounter (Signed)
RF alprazolam. Last OV 06/26/19 Next OV 12/25/19 Last RF 02/25/2019 # 120 x 5 rfs.  Please advise.

## 2019-08-05 DIAGNOSIS — Z1211 Encounter for screening for malignant neoplasm of colon: Secondary | ICD-10-CM | POA: Diagnosis not present

## 2019-08-10 LAB — COLOGUARD
COLOGUARD: NEGATIVE
Cologuard: NEGATIVE

## 2019-08-11 ENCOUNTER — Other Ambulatory Visit: Payer: Self-pay

## 2019-08-11 DIAGNOSIS — Z1211 Encounter for screening for malignant neoplasm of colon: Secondary | ICD-10-CM

## 2019-08-11 NOTE — Addendum Note (Signed)
Addended by: Alysia Penna on: 08/11/2019 02:29 PM   Modules accepted: Orders

## 2019-12-19 ENCOUNTER — Other Ambulatory Visit: Payer: Self-pay | Admitting: Family Medicine

## 2019-12-25 ENCOUNTER — Ambulatory Visit: Payer: Medicare HMO | Admitting: Family Medicine

## 2019-12-30 ENCOUNTER — Other Ambulatory Visit: Payer: Self-pay

## 2020-01-05 ENCOUNTER — Encounter: Payer: Self-pay | Admitting: Family Medicine

## 2020-01-05 ENCOUNTER — Ambulatory Visit (INDEPENDENT_AMBULATORY_CARE_PROVIDER_SITE_OTHER): Payer: Medicare Other | Admitting: Family Medicine

## 2020-01-05 ENCOUNTER — Other Ambulatory Visit: Payer: Self-pay

## 2020-01-05 VITALS — BP 103/70 | HR 74 | Temp 97.8°F | Resp 16 | Ht 62.0 in | Wt 157.2 lb

## 2020-01-05 DIAGNOSIS — F411 Generalized anxiety disorder: Secondary | ICD-10-CM

## 2020-01-05 DIAGNOSIS — F329 Major depressive disorder, single episode, unspecified: Secondary | ICD-10-CM | POA: Diagnosis not present

## 2020-01-05 DIAGNOSIS — J310 Chronic rhinitis: Secondary | ICD-10-CM

## 2020-01-05 DIAGNOSIS — F32A Depression, unspecified: Secondary | ICD-10-CM

## 2020-01-05 MED ORDER — FLUTICASONE PROPIONATE 50 MCG/ACT NA SUSP: NASAL | 0 refills | Status: DC

## 2020-01-05 MED ORDER — FEXOFENADINE-PSEUDOEPHED ER 60-120 MG PO TB12: ORAL_TABLET | ORAL | 3 refills | Status: DC

## 2020-01-05 NOTE — Progress Notes (Signed)
OFFICE VISIT  01/05/2020   CC:  Chief Complaint  Patient presents with  . Follow-up    RCI, pt is not fasting   HPI:    Patient is a 64 y.o. Caucasian female who presents for 6 mo f/u GAD with high risk medication use. A/P as of last visit: "1) GAD, with high risk med use: The current medical regimen is effective;  continue present plan and medications. No new rx for alprazolam needed today. I did RF citalopram x 3 mo with 3 additional RFs. CSC updated today, UDS obtained today.  2) Health maintenance exam: Reviewed age and gender appropriate health maintenance issues (prudent diet, regular exercise, health risks of tobacco and excessive alcohol, use of seatbelts, fire alarms in home, use of sunscreen).  Also reviewed age and gender appropriate health screening as well as vaccine recommendations. Vaccines: Flu->pt declines.  Pneumovax->declines.  Shingrix->she has had this. Labs: fasting HP labs ordered. Cervical ca screening: pt defers this for now and will call back if she changes her mind. Breast ca screening: pt overdue for screening mammography->she declines at this tiime. Colon ca screening: Pt has never had colon ca screening->cologuard will be arranged. Osteoporosis screening: pt declines any screening for this."  INTERIM HX: Struggling more lately with depressed mood, all surrounding family circumstances, esp her grandson whom she raised 8yr and he moved out about 10 mo ago. No SI or HI.  She still functions fine, is in prayer, good support of husband. Taking meds regularly w/out side effects.  No alc/drug use. Pt has been on citalopram and xanax long term for her chronic anxiety and these have been helpful. No hx of any med misuse/abuse, nor has there ever been any suspicion of diversion.  PMP AWARE reviewed today: most recent rx for alpraz 2mg  was filled 12/19/19, # 120, rx by me. No red flags. CSC and UDS are UTD.  Also, has worsening last 6-8 mo of chronic rhinitis  sx's, frequent sinus pressure and "sinus HA's". No fevers, cough, ST, or facial pain.  Takes flonase daily but ? Of help or not. Takes "dollar store" allergy pill most days.  ROS: no fevers, no CP, no SOB, no wheezing, no cough, no dizziness, no rashes, no melena/hematochezia.  No polyuria or polydipsia.  No myalgias or arthralgias.  No focal weakness, paresthesias, or tremors.  No acute vision or hearing abnormalities. No n/v/d or abd pain.  No palpitations.    Past Medical History:  Diagnosis Date  . Anxiety   . Depression   . Herpes zoster 03/2017   R side of abdomen    History reviewed. No pertinent surgical history.  Outpatient Medications Prior to Visit  Medication Sig Dispense Refill  . alprazolam (XANAX) 2 MG tablet TAKE ONE TABLET BY MOUTH FOUR TIMES DAILY AS NEEDED FOR ANXIETY 120 tablet 5  . amoxicillin (AMOXIL) 500 MG capsule Take 500 mg by mouth 4 (four) times daily.    . citalopram (CELEXA) 40 MG tablet Take 1 tablet (40 mg total) by mouth daily. 90 tablet 3  . ibuprofen (ADVIL,MOTRIN) 400 MG tablet Take 1 tablet (400 mg total) every 6 (six) hours as needed by mouth for moderate pain. 30 tablet 0  . fluticasone (FLONASE) 50 MCG/ACT nasal spray USE 2 SPRAYS IN EACH NOSTRIL ONCE DAILY. SHAKE GENTLY BEFORE USING. 16 g 0  . ibuprofen (ADVIL) 800 MG tablet Take 800 mg by mouth every 8 (eight) hours as needed. (Patient not taking: Reported on 01/05/2020)    .  HYDROcodone-acetaminophen (NORCO/VICODIN) 5-325 MG tablet Take 1 tablet by mouth every 6 (six) hours as needed. (Patient not taking: Reported on 01/05/2020)     No facility-administered medications prior to visit.    No Known Allergies  ROS As per HPI  PE: Blood pressure 103/70, pulse 74, temperature 97.8 F (36.6 C), temperature source Temporal, resp. rate 16, height 5\' 2"  (1.575 m), weight 157 lb 3.2 oz (71.3 kg), SpO2 96 %. Gen: Alert, well appearing.  Patient is oriented to person, place, time, and  situation. AFFECT: sad and somewhat flat.  Lucid thought and speech. Tearful at times. : no injection, icteris, swelling, or exudate.  EOMI, PERRLA. Mouth: lips without lesion/swelling.  Oral mucosa pink and moist. Oropharynx without erythema, exudate, or swelling. +mild nasal congestion bilat.  No facial tenderness to palpation, no facial swelling. CV: RRR, no m/r/g.   LUNGS: CTA bilat, nonlabored resps, good aeration in all lung fields. EXT: no clubbing or cyanosis.  no edema.    LABS:  No results found for: TSH Lab Results  Component Value Date   WBC 5.2 06/26/2019   HGB 13.9 06/26/2019   HCT 39.7 06/26/2019   MCV 86.7 06/26/2019   PLT 203 06/26/2019   Lab Results  Component Value Date   CREATININE 0.77 06/26/2019   BUN 9 06/26/2019   NA 142 06/26/2019   K 3.7 06/26/2019   CL 108 06/26/2019   CO2 25 06/26/2019   Lab Results  Component Value Date   ALT 5 (L) 06/26/2019   AST 16 06/26/2019   BILITOT 0.6 06/26/2019   Lab Results  Component Value Date   CHOL 227 (H) 06/26/2019   Lab Results  Component Value Date   HDL 67 06/26/2019   Lab Results  Component Value Date   LDLCALC 136 (H) 06/26/2019   Lab Results  Component Value Date   TRIG 119 06/26/2019   Lab Results  Component Value Date   CHOLHDL 3.4 06/26/2019   IMPRESSION AND PLAN:  1) Chronic anx/dep, fluctuating severity but she is overall satisfied with meds at this time. She is working on making an emotional break from her grandson whom she had been very close to but is no longer in much contact with at all. Gave encouragement today, listened, no med changes. No new med rx's were needed today. She is not interested in formal counseling.  2) Chronic rhinitis, not particularly seasonal. Continue flonase qd and will add allegra D, 1 AM today. No sign of acute sinusitis.  An After Visit Summary was printed and given to the patient.  FOLLOW UP: Return in about 6 months (around 07/07/2020)  for annual CPE (fasting).  Signed:  07/09/2020, MD           01/05/2020

## 2020-02-05 ENCOUNTER — Other Ambulatory Visit: Payer: Self-pay

## 2020-02-05 ENCOUNTER — Telehealth: Payer: Self-pay | Admitting: Family Medicine

## 2020-02-05 MED ORDER — FLUTICASONE PROPIONATE 50 MCG/ACT NA SUSP: NASAL | 2 refills | Status: DC

## 2020-02-05 NOTE — Telephone Encounter (Signed)
Refill sent with 2 additional refills. Left detailed message advising patient of this. Okay per dpr

## 2020-02-05 NOTE — Telephone Encounter (Signed)
Pt would a refill on Flonase called to [facility] pharmacy please.

## 2020-02-17 ENCOUNTER — Other Ambulatory Visit: Payer: Self-pay | Admitting: Family Medicine

## 2020-02-18 ENCOUNTER — Other Ambulatory Visit: Payer: Self-pay

## 2020-02-18 ENCOUNTER — Telehealth: Payer: Self-pay

## 2020-02-18 DIAGNOSIS — Z1239 Encounter for other screening for malignant neoplasm of breast: Secondary | ICD-10-CM

## 2020-02-18 DIAGNOSIS — Z124 Encounter for screening for malignant neoplasm of cervix: Secondary | ICD-10-CM

## 2020-02-18 NOTE — Telephone Encounter (Signed)
OK to get pneumovax 23 and flu vaccine at the same time. Pls enter referral order to [facility] [place], dx cervical cancer screening and breast cancer screening. Specify "pt requests female provider" in comments section of referral.-thx

## 2020-02-18 NOTE — Telephone Encounter (Signed)
Patient notified ok to get pneumonia and flu shot at the same time. Nurse visit scheduled for next month to receive both. Referral entered. Patient also notified refill for alprazolam sent in.

## 2020-02-18 NOTE — Telephone Encounter (Signed)
Patient called stating she would like to get pneumonia shot at the same time as the flu shot, if possible. She would also like referral to female GYN for pap smear and mammogram to be completed.  Please advise, thanks.

## 2020-02-18 NOTE — Telephone Encounter (Signed)
Contract: UTD (06/26/19) UDS: n/a-due Last Visit: 01/05/20 Next Visit: 06/27/20 Last Refill: ~01/27/20  Please Advise

## 2020-02-22 ENCOUNTER — Other Ambulatory Visit: Payer: Self-pay

## 2020-02-22 DIAGNOSIS — Z1239 Encounter for other screening for malignant neoplasm of breast: Secondary | ICD-10-CM

## 2020-02-22 DIAGNOSIS — Z124 Encounter for screening for malignant neoplasm of cervix: Secondary | ICD-10-CM

## 2020-04-11 ENCOUNTER — Ambulatory Visit (INDEPENDENT_AMBULATORY_CARE_PROVIDER_SITE_OTHER): Payer: Medicare Other

## 2020-04-11 ENCOUNTER — Other Ambulatory Visit: Payer: Self-pay

## 2020-04-11 ENCOUNTER — Ambulatory Visit: Payer: Medicare Other

## 2020-04-11 DIAGNOSIS — Z23 Encounter for immunization: Secondary | ICD-10-CM | POA: Diagnosis not present

## 2020-04-18 ENCOUNTER — Other Ambulatory Visit: Payer: Self-pay | Admitting: Family Medicine

## 2020-04-19 NOTE — Progress Notes (Deleted)
Subjective:   Diana Pittman is a 64 y.o. female who presents for an Initial Medicare Annual Wellness Visit.  I connected with Caeli today by telephone and verified that I am speaking with the correct person using two identifiers. Location patient: home Location provider: work Persons participating in the virtual visit: patient, Engineer, civil (consulting).    I discussed the limitations, risks, security and privacy concerns of performing an evaluation and management service by telephone and the availability of in person appointments. I also discussed with the patient that there may be a patient responsible charge related to this service. The patient expressed understanding and verbally consented to this telephonic visit.    Interactive audio and video telecommunications were attempted between this provider and patient, however failed, due to patient having technical difficulties OR patient did not have access to video capability.  We continued and completed visit with audio only.  Some vital signs may be absent or patient reported.   Time Spent with patient on telephone encounter: *** minutes   Review of Systems    ***       Objective:    There were no vitals filed for this visit. There is no height or weight on file to calculate BMI.  Advanced Directives 04/01/2017  Does Patient Have a Medical Advance Directive? No    Current Medications (verified) Outpatient Encounter Medications as of 04/20/2020  Medication Sig  . alprazolam (XANAX) 2 MG tablet TAKE ONE TABLET BY MOUTH FOUR TIMES DAILY AS NEEDED FOR ANXIETY  . amoxicillin (AMOXIL) 500 MG capsule Take 500 mg by mouth 4 (four) times daily.  . citalopram (CELEXA) 40 MG tablet Take 1 tablet (40 mg total) by mouth daily.  . fexofenadine-pseudoephedrine (ALLEGRA-D) 60-120 MG 12 hr tablet 1 tab po qAM for allergy symptoms  . fluticasone (FLONASE) 50 MCG/ACT nasal spray USE 2 SPRAYS IN EACH NOSTRIL ONCE DAILY. SHAKE GENTLY BEFORE USING.  Marland Kitchen ibuprofen  (ADVIL) 800 MG tablet Take 800 mg by mouth every 8 (eight) hours as needed. (Patient not taking: Reported on 01/05/2020)  . ibuprofen (ADVIL,MOTRIN) 400 MG tablet Take 1 tablet (400 mg total) every 6 (six) hours as needed by mouth for moderate pain.   No facility-administered encounter medications on file as of 04/20/2020.    Allergies (verified) Patient has no known allergies.   History: Past Medical History:  Diagnosis Date  . Anxiety   . Depression   . Herpes zoster 03/2017   R side of abdomen   No past surgical history on file. Family History  Problem Relation Age of Onset  . Mental illness Mother   . Arthritis Mother   . Hypertension Mother   . Stroke Mother   . Diabetes Mother   . Mental illness Father   . Arthritis Father   . Hypertension Father   . Stroke Father   . Diabetes Father    Social History   Socioeconomic History  . Marital status: Married    Spouse name: Not on file  . Number of children: Not on file  . Years of education: Not on file  . Highest education level: Not on file  Occupational History  . Not on file  Tobacco Use  . Smoking status: Never Smoker  . Smokeless tobacco: Never Used  Substance and Sexual Activity  . Alcohol use: No  . Drug use: No  . Sexual activity: Not on file  Other Topics Concern  . Not on file  Social History Narrative   Married, 3  sons.   Housewife.   10th grade education.   No T/A/Ds.   Social Determinants of Health   Financial Resource Strain:   . Difficulty of Paying Living Expenses: Not on file  Food Insecurity:   . Worried About Programme researcher, broadcasting/film/video in the Last Year: Not on file  . Ran Out of Food in the Last Year: Not on file  Transportation Needs:   . Lack of Transportation (Medical): Not on file  . Lack of Transportation (Non-Medical): Not on file  Physical Activity:   . Days of Exercise per Week: Not on file  . Minutes of Exercise per Session: Not on file  Stress:   . Feeling of Stress : Not on  file  Social Connections:   . Frequency of Communication with Friends and Family: Not on file  . Frequency of Social Gatherings with Friends and Family: Not on file  . Attends Religious Services: Not on file  . Active Member of Clubs or Organizations: Not on file  . Attends Banker Meetings: Not on file  . Marital Status: Not on file    Tobacco Counseling Counseling given: Not Answered   Clinical Intake:                 Diabetic?No         Activities of Daily Living No flowsheet data found.  Patient Care Team: Jeoffrey Massed, MD as PCP - General (Family Medicine)  Indicate any recent Medical Services you may have received from other than [facility] providers in the past year (date may be approximate).     Assessment:   This is a routine wellness examination for Diana Pittman.  Hearing/Vision screen No exam data present  Dietary issues and exercise activities discussed:    Goals   None    Depression Screen PHQ 2/9 Scores 01/05/2020 06/26/2019 06/20/2018 06/21/2017 12/20/2016  PHQ - 2 Score 4 1 5 4 4   PHQ- 9 Score 14 4 12 13 12     Fall Risk Fall Risk  06/26/2019  Falls in the past year? 0  Number falls in past yr: 0  Injury with Fall? 0  Follow up Falls evaluation completed    FALL RISK PREVENTION PERTAINING TO THE HOME:  Any stairs in or around the home? {YES/NO:21197} If so, are there any without handrails? {YES/NO:21197} Home free of loose throw rugs in walkways, pet beds, electrical cords, etc? {YES/NO:21197} Adequate lighting in your home to reduce risk of falls? {YES/NO:21197}  ASSISTIVE DEVICES UTILIZED TO PREVENT FALLS:  Life alert? {YES/NO:21197} Use of a cane, walker or w/c? {YES/NO:21197} Grab bars in the bathroom? {YES/NO:21197} Shower chair or bench in shower? {YES/NO:21197} Elevated toilet seat or a handicapped toilet? {YES/NO:21197}  TIMED UP AND GO:  Was the test performed? {YES/NO:21197}.  Length of time to ambulate 10  feet: *** sec.   {Appearance of  Cognitive Function:        Immunizations Immunization History  Administered Date(s) Administered  . Fluad Quad(high Dose 65+) 04/11/2020  . Pneumococcal Polysaccharide-23 04/11/2020  . Td 05/15/2011    TDAP status: Up to date  Flu Vaccine status: Up to date  Pneumococcal vaccine status: Up to date  {Covid-19 vaccine status:2101808}  Qualifies for Shingles Vaccine? Yes   Zostavax completed No   Shingrix Completed?: No.    Education has been provided regarding the importance of this vaccine. Patient has been advised to call insurance company to determine out of pocket expense if they have  not yet received this vaccine. Advised may also receive vaccine at local pharmacy or Health Dept. Verbalized acceptance and understanding.  Screening Tests Health Maintenance  Topic Date Due  . COVID-19 Vaccine (1) Never done  . MAMMOGRAM  Never done  . COLONOSCOPY  Never done  . DEXA SCAN  Never done  . PNA vac Low Risk Adult (2 of 2 - PCV13) 04/11/2021  . TETANUS/TDAP  05/14/2021  . INFLUENZA VACCINE  Completed  . Hepatitis C Screening  Discontinued    Health Maintenance  Health Maintenance Due  Topic Date Due  . COVID-19 Vaccine (1) Never done  . MAMMOGRAM  Never done  . COLONOSCOPY  Never done  . DEXA SCAN  Never done    {Colorectal cancer screening:2101809}  {Mammogram status:21018020}  {Bone Density status:21018021}  Lung Cancer Screening: (Low Dose CT Chest recommended if Age 52-80 years, 30 pack-year currently smoking OR have quit w/in 15years.) does not qualify.     Additional Screening:  Hepatitis C Screening: does qualify; Discuss with PCP  Vision Screening: Recommended annual ophthalmology exams for early detection of glaucoma and other disorders of the eye. Is the patient up to date with their annual eye exam?  {YES/NO:21197} Who is the provider or what is the name of the office in which the patient attends  annual eye exams? *** If pt is not established with a provider, would they like to be referred to a provider to establish care? {YES/NO:21197}.   Dental Screening: Recommended annual dental exams for proper oral hygiene  Community Resource Referral / Chronic Care Management: CRR required this visit?  {YES/NO:21197}  CCM required this visit?  {YES/NO:21197}     Plan:     I have personally reviewed and noted the following in the patient's chart:   . Medical and social history . Use of alcohol, tobacco or illicit drugs  . Current medications and supplements . Functional ability and status . Nutritional status . Physical activity . Advanced directives . List of other physicians . Hospitalizations, surgeries, and ER visits in previous 12 months . Vitals . Screenings to include cognitive, depression, and falls . Referrals and appointments  In addition, I have reviewed and discussed with patient certain preventive protocols, quality metrics, and best practice recommendations. A written personalized care plan for preventive services as well as general preventive health recommendations were provided to patient.   Due to this being a telephonic visit, the after visit summary with patients personalized plan was offered to patient via mail or my-chart. ***Patient declined at this time./ Patient would like to access on my-chart/ per request, patient was mailed a copy of AVS./ Patient preferred to pick up at office at next visit.   Roanna Raider, LPN   19/08/1738  Nurse Health Advisor  Nurse Notes: ***

## 2020-04-20 ENCOUNTER — Ambulatory Visit: Payer: Medicare Other

## 2020-04-20 ENCOUNTER — Telehealth: Payer: Self-pay

## 2020-04-20 NOTE — Telephone Encounter (Signed)
Five attempts made to reach patient for scheduled Medicare Wellness visit. No answer. Unable to leave a message.

## 2020-05-19 ENCOUNTER — Other Ambulatory Visit: Payer: Self-pay | Admitting: Family Medicine

## 2020-06-14 DIAGNOSIS — E7800 Pure hypercholesterolemia, unspecified: Secondary | ICD-10-CM

## 2020-06-14 HISTORY — DX: Pure hypercholesterolemia, unspecified: E78.00

## 2020-06-18 ENCOUNTER — Other Ambulatory Visit: Payer: Self-pay | Admitting: Family Medicine

## 2020-06-27 ENCOUNTER — Encounter: Payer: Medicare Other | Admitting: Family Medicine

## 2020-07-11 ENCOUNTER — Other Ambulatory Visit: Payer: Self-pay

## 2020-07-11 ENCOUNTER — Telehealth: Payer: Self-pay | Admitting: Family Medicine

## 2020-07-11 ENCOUNTER — Ambulatory Visit (INDEPENDENT_AMBULATORY_CARE_PROVIDER_SITE_OTHER): Payer: Medicare Other | Admitting: Family Medicine

## 2020-07-11 ENCOUNTER — Encounter: Payer: Self-pay | Admitting: Family Medicine

## 2020-07-11 VITALS — BP 108/74 | HR 87 | Temp 98.4°F | Resp 16 | Ht 62.0 in | Wt 148.6 lb

## 2020-07-11 DIAGNOSIS — F339 Major depressive disorder, recurrent, unspecified: Secondary | ICD-10-CM | POA: Diagnosis not present

## 2020-07-11 DIAGNOSIS — Z0000 Encounter for general adult medical examination without abnormal findings: Secondary | ICD-10-CM

## 2020-07-11 DIAGNOSIS — F4321 Adjustment disorder with depressed mood: Secondary | ICD-10-CM

## 2020-07-11 MED ORDER — CITALOPRAM HYDROBROMIDE 40 MG PO TABS: 40.0000 mg | ORAL_TABLET | Freq: Every day | ORAL | 3 refills | Status: DC

## 2020-07-11 NOTE — Addendum Note (Signed)
Addended by: Paschal Dopp on: 07/11/2020 05:07 PM   Modules accepted: Orders

## 2020-07-11 NOTE — Telephone Encounter (Signed)
Patient requesting call regarding depression. Did not want to give further details.

## 2020-07-11 NOTE — Telephone Encounter (Signed)
Will follow up with patient after 2:30 virtual roomed.

## 2020-07-11 NOTE — Telephone Encounter (Signed)
Noted  

## 2020-07-11 NOTE — Telephone Encounter (Signed)
FYI. Please see below.  Spoke with patient regarding depression, she explained it felt a dream that son was gone. She has not eaten in weeks and as long as she believes son is in [place], she is able to deal with it and be ok. Husband said she made a fool out of him as they were getting into the car. She wanted Dr.McGowen to know she is just grieving in her own way and is unsure if she would be able to function if she really faced reality that he is gone. Advised I would let him know.

## 2020-07-11 NOTE — Progress Notes (Signed)
Office Note 07/11/2020  CC:  Chief Complaint  Patient presents with  . Annual Exam    Pt is fasting    HPI:  Diana Pittman is a 65 y.o. White female who is here for annual health maintenance exam. A/P as of last visit: "1) Chronic anx/dep, fluctuating severity but she is overall satisfied with meds at this time. She is working on making an emotional break from her grandson whom she had been very close to but is no longer in much contact with at all. Gave encouragement today, listened, no med changes. No new med rx's were needed today. She is not interested in formal counseling.  2) Chronic rhinitis, not particularly seasonal. Continue flonase qd and will add allegra D, 1 AM today. No sign of acute sinusitis."  INTERIM HX: Grieving due to loss of 26 y/o son a couple weeks ago. Poor sleep.   No SI or HI.   She is taking citalopram 40mg  qd and xanax 2mg  q6h as per her usual. PMP AWARE reviewed today: most recent rx for alpraz 2mg  was filled 06/18/20, # 120, rx by me. No red flags.   Past Medical History:  Diagnosis Date  . Anxiety   . Depression   . Herpes zoster 03/2017   R side of abdomen    History reviewed. No pertinent surgical history.  Family History  Problem Relation Age of Onset  . Mental illness Mother   . Arthritis Mother   . Hypertension Mother   . Stroke Mother   . Diabetes Mother   . Mental illness Father   . Arthritis Father   . Hypertension Father   . Stroke Father   . Diabetes Father     Social History   Socioeconomic History  . Marital status: Married    Spouse name: Not on file  . Number of children: Not on file  . Years of education: Not on file  . Highest education level: Not on file  Occupational History  . Not on file  Tobacco Use  . Smoking status: Never Smoker  . Smokeless tobacco: Never Used  Substance and Sexual Activity  . Alcohol use: No  . Drug use: No  . Sexual activity: Not on file  Other Topics Concern  . Not  on file  Social History Narrative   Married, 3 sons.   Housewife.   10th grade education.   No T/A/Ds.   Social Determinants of Health   Financial Resource Strain: Not on file  Food Insecurity: Not on file  Transportation Needs: Not on file  Physical Activity: Not on file  Stress: Not on file  Social Connections: Not on file  Intimate Partner Violence: Not on file    Outpatient Medications Prior to Visit  Medication Sig Dispense Refill  . alprazolam (XANAX) 2 MG tablet TAKE ONE TABLET BY MOUTH FOUR TIMES DAILY AS NEEDED FOR ANXIETY 120 tablet 5  . fluticasone (FLONASE) 50 MCG/ACT nasal spray USE 2 SPRAYS IN EACH NOSTRIL ONCE DAILY.  SHAKE GENTLY BEFORE USING. 16 g 1  . citalopram (CELEXA) 40 MG tablet Take 1 tablet (40 mg total) by mouth daily. 90 tablet 3  . ibuprofen (ADVIL) 800 MG tablet Take 800 mg by mouth every 8 (eight) hours as needed.    . fexofenadine-pseudoephedrine (ALLEGRA-D) 60-120 MG 12 hr tablet 1 tab po qAM for allergy symptoms (Patient not taking: Reported on 07/11/2020) 90 tablet 3  . amoxicillin (AMOXIL) 500 MG capsule Take 500 mg by mouth 4 (  four) times daily. (Patient not taking: Reported on 07/11/2020)    . ibuprofen (ADVIL,MOTRIN) 400 MG tablet Take 1 tablet (400 mg total) every 6 (six) hours as needed by mouth for moderate pain. (Patient not taking: Reported on 07/11/2020) 30 tablet 0   No facility-administered medications prior to visit.    No Known Allergies  ROS Review of Systems  Constitutional: Negative for appetite change, chills, fatigue and fever.  HENT: Negative for congestion, dental problem, ear pain and sore throat.   Eyes: Negative for discharge, redness and visual disturbance.  Respiratory: Negative for cough, chest tightness, shortness of breath and wheezing.   Cardiovascular: Negative for chest pain, palpitations and leg swelling.  Gastrointestinal: Negative for abdominal pain, blood in stool, diarrhea, nausea and vomiting.   Genitourinary: Negative for difficulty urinating, dysuria, flank pain, frequency, hematuria and urgency.  Musculoskeletal: Negative for arthralgias, back pain, joint swelling, myalgias and neck stiffness.  Skin: Negative for pallor and rash.  Neurological: Negative for dizziness, speech difficulty, weakness and headaches.  Hematological: Negative for adenopathy. Does not bruise/bleed easily.  Psychiatric/Behavioral: Positive for dysphoric mood and sleep disturbance. Negative for confusion and suicidal ideas. The patient is nervous/anxious.     PE; Vitals with BMI 07/11/2020 01/05/2020 06/26/2019  Height 5\' 2"  5\' 2"  5\' 2"   Weight 148 lbs 10 oz 157 lbs 3 oz 145 lbs  BMI 27.17 28.74 26.51  Systolic 108 103  Diastolic 74 70 73  Pulse 87 74 82    Gen: Alert, well appearing.  Patient is oriented to person, place, time, and situation. AFFECT: pleasant, lucid thought and speech. ENT: Ears: EACs clear, normal epithelium.  TMs with good light reflex and landmarks bilaterally.  Eyes: no injection, icteris, swelling, or exudate.  EOMI, PERRLA. Nose: no drainage or turbinate edema/swelling.  No injection or focal lesion.  Mouth: lips without lesion/swelling.  Oral mucosa pink and moist.  Dentition intact and without obvious caries or gingival swelling.  Oropharynx without erythema, exudate, or swelling.  Neck: supple/nontender.  No LAD, mass, or TM.  Carotid pulses 2+ bilaterally, without bruits. CV: RRR, no m/r/g.   LUNGS: CTA bilat, nonlabored resps, good aeration in all lung fields. ABD: soft, NT, ND, BS normal.  No hepatospenomegaly or mass.  No bruits. EXT: no clubbing, cyanosis, or edema.  Musculoskeletal: no joint swelling, erythema, warmth, or tenderness.  ROM of all joints intact. Skin - no sores or suspicious lesions or rashes or color changes  Pertinent labs:  No results found for: TSH Lab Results  Component Value Date   WBC 5.2 06/26/2019   HGB 13.9 06/26/2019   HCT 39.7  06/26/2019   MCV 86.7 06/26/2019   PLT 203 06/26/2019   Lab Results  Component Value Date   CREATININE 0.77 06/26/2019   BUN 9 06/26/2019   NA 142 06/26/2019   K 3.7 06/26/2019   CL 108 06/26/2019   CO2 25 06/26/2019   Lab Results  Component Value Date   ALT 5 (L) 06/26/2019   AST 16 06/26/2019   BILITOT 0.6 06/26/2019   Lab Results  Component Value Date   CHOL 227 (H) 06/26/2019   Lab Results  Component Value Date   HDL 67 06/26/2019   Lab Results  Component Value Date   LDLCALC 136 (H) 06/26/2019   Lab Results  Component Value Date   TRIG 119 06/26/2019   Lab Results  Component Value Date   CHOLHDL 3.4 06/26/2019    ASSESSMENT AND PLAN:  Health maintenance exam: Reviewed age and gender appropriate health maintenance issues (prudent diet, regular exercise, health risks of tobacco and excessive alcohol, use of seatbelts, fire alarms in home, use of sunscreen).  Also reviewed age and gender appropriate health screening as well as vaccine recommendations. Vaccines: Prevnar 13 due 04/2021.   Labs: cmet, flp, cbc. Cervical ca screening: pt has declined. Breast ca screening: pt has declined. Colon ca screening: rpt cologuard 07/2022.  GRIEVING in context of recurrent/chronic depression and anxiety: gave emotional support. No change in meds today. Gave handout with info on free grief counseling through [facility] [place].  An After Visit Summary was printed and given to the patient.  FOLLOW UP:  Return in about 6 months (around 01/08/2021) for routine chronic illness f/u.  Signed:  Santiago Bumpers, MD           07/11/2020

## 2020-07-12 ENCOUNTER — Telehealth: Payer: Self-pay

## 2020-07-12 ENCOUNTER — Encounter: Payer: Self-pay | Admitting: Family Medicine

## 2020-07-12 DIAGNOSIS — E876 Hypokalemia: Secondary | ICD-10-CM

## 2020-07-12 LAB — COMPREHENSIVE METABOLIC PANEL
AG Ratio: 1.3 (calc) (ref 1.0–2.5)
ALT: 11 U/L (ref 6–29)
AST: 19 U/L (ref 10–35)
Albumin: 4.3 g/dL (ref 3.6–5.1)
Alkaline phosphatase (APISO): 59 U/L (ref 37–153)
BUN: 9 mg/dL (ref 7–25)
CO2: 27 mmol/L (ref 20–32)
Calcium: 9.6 mg/dL (ref 8.6–10.4)
Chloride: 104 mmol/L (ref 98–110)
Creat: 0.76 mg/dL (ref 0.50–0.99)
Globulin: 3.4 g/dL (calc) (ref 1.9–3.7)
Glucose, Bld: 83 mg/dL (ref 65–99)
Potassium: 3.2 mmol/L — ABNORMAL LOW (ref 3.5–5.3)
Sodium: 143 mmol/L (ref 135–146)
Total Bilirubin: 0.6 mg/dL (ref 0.2–1.2)
Total Protein: 7.7 g/dL (ref 6.1–8.1)

## 2020-07-12 LAB — CBC
HCT: 41.4 % (ref 35.0–45.0)
Hemoglobin: 14.2 g/dL (ref 11.7–15.5)
MCH: 29.2 pg (ref 27.0–33.0)
MCHC: 34.3 g/dL (ref 32.0–36.0)
MCV: 85 fL (ref 80.0–100.0)
MPV: 10.5 fL (ref 7.5–12.5)
Platelets: 241 10*3/uL (ref 140–400)
RBC: 4.87 10*6/uL (ref 3.80–5.10)
RDW: 12.7 % (ref 11.0–15.0)
WBC: 6.8 10*3/uL (ref 3.8–10.8)

## 2020-07-12 LAB — LIPID PANEL
Cholesterol: 233 mg/dL — ABNORMAL HIGH (ref <200)
HDL: 65 mg/dL (ref >=50)
LDL Cholesterol (Calc): 148 mg/dL (calc) — ABNORMAL HIGH
Non-HDL Cholesterol (Calc): 168 mg/dL (calc) — ABNORMAL HIGH (ref <130)
Total CHOL/HDL Ratio: 3.6 (calc) (ref <5.0)
Triglycerides: 92 mg/dL (ref <150)

## 2020-07-12 MED ORDER — POTASSIUM CHLORIDE CRYS ER 20 MEQ PO TBCR: 20.0000 meq | EXTENDED_RELEASE_TABLET | Freq: Every day | ORAL | 5 refills | Status: DC

## 2020-07-12 NOTE — Telephone Encounter (Signed)
-----   Message from Jeoffrey Massed, MD sent at 07/12/2020  6:57 AM EST ----- (8 yr [place] cv risk= 8%).  Cholesterol mildly elevated but not quite to the level of needing medication. Potassium a little low.  Pls eRx KDUR 20 mEQ tabs, 1 tabs po qd, #60, RF x 5. Lab visit for bmet 2 wks, dx hypokalemia. -thx

## 2020-07-12 NOTE — Telephone Encounter (Signed)
Spoke with pt regarding labs and instructions.   FYI: pt is coming in office to see PCP since husband is scheduled

## 2020-07-18 ENCOUNTER — Other Ambulatory Visit: Payer: Self-pay | Admitting: Family Medicine

## 2020-07-18 NOTE — Telephone Encounter (Signed)
Requesting: Alprazolam 2mg   Contract:07/11/20 UDS: 06/26/19 Last Visit: 07/11/20 Next Visit: 07/26/20 Last Refill: 02/18/20(120,5)  Please Advise. Medication pending

## 2020-07-19 NOTE — Telephone Encounter (Signed)
Rx sent, patient made aware.  

## 2020-07-26 ENCOUNTER — Other Ambulatory Visit: Payer: Self-pay

## 2020-07-26 ENCOUNTER — Encounter: Payer: Self-pay | Admitting: Family Medicine

## 2020-07-26 ENCOUNTER — Ambulatory Visit: Payer: Medicare Other | Admitting: Family Medicine

## 2020-07-26 ENCOUNTER — Other Ambulatory Visit: Payer: Self-pay | Admitting: Family Medicine

## 2020-07-26 ENCOUNTER — Ambulatory Visit (INDEPENDENT_AMBULATORY_CARE_PROVIDER_SITE_OTHER): Payer: Medicare Other | Admitting: Family Medicine

## 2020-07-26 VITALS — BP 110/60 | HR 75 | Temp 98.0°F | Resp 16 | Ht 62.0 in | Wt 146.4 lb

## 2020-07-26 DIAGNOSIS — E876 Hypokalemia: Secondary | ICD-10-CM

## 2020-07-26 DIAGNOSIS — F4321 Adjustment disorder with depressed mood: Secondary | ICD-10-CM | POA: Diagnosis not present

## 2020-07-26 LAB — BASIC METABOLIC PANEL
BUN: 8 mg/dL (ref 6–23)
CO2: 25 mEq/L (ref 19–32)
Calcium: 9.4 mg/dL (ref 8.4–10.5)
Chloride: 105 mEq/L (ref 96–112)
Creatinine, Ser: 0.8 mg/dL (ref 0.40–1.20)
GFR: 76.59 mL/min (ref >60.00)
Glucose, Bld: 111 mg/dL — ABNORMAL HIGH (ref 70–99)
Potassium: 4 mEq/L (ref 3.5–5.1)
Sodium: 138 mEq/L (ref 135–145)

## 2020-07-26 NOTE — Progress Notes (Signed)
OFFICE VISIT  07/26/2020  CC:  Chief Complaint  Patient presents with  . Follow-up    Grief/depression    HPI:    Patient is a 65 y.o. Caucasian female who presents for 2 wk f/u grief reaction superimposed on chronic depression. A/P as of last visit: "Health maintenance exam: Reviewed age and gender appropriate health maintenance issues (prudent diet, regular exercise, health risks of tobacco and excessive alcohol, use of seatbelts, fire alarms in home, use of sunscreen).  Also reviewed age and gender appropriate health screening as well as vaccine recommendations. Vaccines: Prevnar 13 due 04/2021.   Labs: cmet, flp, cbc. Cervical ca screening: pt has declined. Breast ca screening: pt has declined. Colon ca screening: rpt cologuard 07/2022.  GRIEVING in context of recurrent/chronic depression and anxiety: gave emotional support. No change in meds today. Gave handout with info on free grief counseling through [facility] [place]."  INTERIM HX: At last visit 2 wks ago her potassium was a little low at 3.2 so I started her on KDUR 20 mEQ qd.  Today we discussed her ongoing grief for the loss of her son last month. She relives the last time she saw him over and over, as to be expected.  No panic attacks but chronic feeling of wanting to cry.  She is gradually getting better. Talks with a friend and a sister, also has support of her husband.  No SI or HI.   Past Medical History:  Diagnosis Date  . Anxiety and depression   . Herpes zoster 03/2017   R side of abdomen  . Hypercholesterolemia 06/2020   TLC  . Hypokalemia     History reviewed. No pertinent surgical history.  Outpatient Medications Prior to Visit  Medication Sig Dispense Refill  . alprazolam (XANAX) 2 MG tablet TAKE ONE TABLET BY MOUTH FOUR TIMES DAILY AS NEEDED FOR ANXIETY 120 tablet 5  . citalopram (CELEXA) 40 MG tablet Take 1 tablet (40 mg total) by mouth daily. 90 tablet 3  . fluticasone (FLONASE) 50 MCG/ACT  nasal spray USE 2 SPRAYS IN EACH NOSTRIL ONCE DAILY.  SHAKE GENTLY BEFORE USING. 16 g 1  . potassium chloride SA (KLOR-CON) 20 MEQ tablet Take 1 tablet (20 mEq total) by mouth daily. 60 tablet 5  . fexofenadine-pseudoephedrine (ALLEGRA-D) 60-120 MG 12 hr tablet 1 tab po qAM for allergy symptoms (Patient not taking: No sig reported) 90 tablet 3   No facility-administered medications prior to visit.    No Known Allergies  ROS As per HPI  PE: Vitals with BMI 07/26/2020 07/11/2020 01/05/2020  Height 5\' 2"  5\' 2"  5\' 2"   Weight 146 lbs 6 oz 148 lbs 10 oz 157 lbs 3 oz  BMI 26.77 27.17 28.74  Systolic 110 108  Diastolic 60 74 70  Pulse 75 87 74   Gen: Alert, well appearing.  Patient is oriented to person, place, time, and situation. AFFECT: pleasant, lucid thought and speech. Tearful/sad most of the time.    LABS:  No results found for: TSH Lab Results  Component Value Date   WBC 6.8 07/11/2020   HGB 14.2 07/11/2020   HCT 41.4 07/11/2020   MCV 85.0 07/11/2020   PLT 241 07/11/2020   Lab Results  Component Value Date   CREATININE 0.76 07/11/2020   BUN 9 07/11/2020   NA 143 07/11/2020   K 3.2 (L) 07/11/2020   CL 104 07/11/2020   CO2 27 07/11/2020   Lab Results  Component Value Date  ALT 11 07/11/2020   AST 19 07/11/2020   BILITOT 0.6 07/11/2020   Lab Results  Component Value Date   CHOL 233 (H) 07/11/2020   Lab Results  Component Value Date   HDL 65 07/11/2020   Lab Results  Component Value Date   LDLCALC 148 (H) 07/11/2020   Lab Results  Component Value Date   TRIG 92 07/11/2020   Lab Results  Component Value Date   CHOLHDL 3.6 07/11/2020     IMPRESSION AND PLAN:  1) Grief rxn, superimposed on chronic anx/dep. She is gradually getting through this.  Gave emotional support. She declined any med changes/additions today.  2) Hypokalemia, mild.   Recheck bmet today since being on low dose rx potassium supplement lately.  An After Visit Summary was  printed and given to the patient.  FOLLOW UP: Return in about 6 months (around 01/26/2021) for routine chronic illness f/u.  Signed:  Santiago Bumpers, MD           07/26/2020

## 2020-08-05 DIAGNOSIS — H524 Presbyopia: Secondary | ICD-10-CM | POA: Diagnosis not present

## 2020-08-19 ENCOUNTER — Other Ambulatory Visit: Payer: Self-pay | Admitting: Family Medicine

## 2020-10-12 ENCOUNTER — Ambulatory Visit (INDEPENDENT_AMBULATORY_CARE_PROVIDER_SITE_OTHER): Payer: Medicare Other

## 2020-10-12 VITALS — Ht 62.0 in | Wt 146.0 lb

## 2020-10-12 DIAGNOSIS — Z0000 Encounter for general adult medical examination without abnormal findings: Secondary | ICD-10-CM

## 2020-10-12 NOTE — Progress Notes (Signed)
Subjective:   Diana Pittman is a 65 y.o. female who presents for an Initial Medicare Annual Wellness Visit.  I connected with Yazhini today by telephone and verified that I am speaking with the correct person using two identifiers. Location patient: home Location provider: work Persons participating in the virtual visit: patient, Engineer, civil (consulting).    I discussed the limitations, risks, security and privacy concerns of performing an evaluation and management service by telephone and the availability of in person appointments. I also discussed with the patient that there may be a patient responsible charge related to this service. The patient expressed understanding and verbally consented to this telephonic visit.    Interactive audio and video telecommunications were attempted between this provider and patient, however failed, due to patient having technical difficulties OR patient did not have access to video capability.  We continued and completed visit with audio only.  Some vital signs may be absent or patient reported.   Time Spent with patient on telephone encounter: 20 minutes   Review of Systems     Cardiac Risk Factors include: advanced age (>26men, >62 women);dyslipidemia;sedentary lifestyle     Objective:    Today's Vitals   10/12/20 1244  Weight: 146 lb (66.2 kg)  Height: 5\' 2"  (1.575 m)  PainSc: 7    Body mass index is 26.7 kg/m.  Advanced Directives 10/12/2020 04/01/2017  Does Patient Have a Medical Advance Directive? No No  Does patient want to make changes to medical advance directive? Yes (MAU/Ambulatory/Procedural Areas - Information given) -    Current Medications (verified) Outpatient Encounter Medications as of 10/12/2020  Medication Sig  . alprazolam (XANAX) 2 MG tablet TAKE ONE TABLET BY MOUTH FOUR TIMES DAILY AS NEEDED FOR ANXIETY  . citalopram (CELEXA) 40 MG tablet Take 1 tablet (40 mg total) by mouth daily.  . fluticasone (FLONASE) 50 MCG/ACT nasal spray USE 2  SPRAYS IN EACH NOSTRIL ONCE DAILY. SHAKE GENTLY BEFORE USING.  . multivitamin-lutein (OCUVITE-LUTEIN) CAPS capsule Take 1 capsule by mouth daily.  . potassium chloride SA (KLOR-CON) 20 MEQ tablet Take 1 tablet (20 mEq total) by mouth daily.  . fexofenadine-pseudoephedrine (ALLEGRA-D) 60-120 MG 12 hr tablet 1 tab po qAM for allergy symptoms (Patient not taking: No sig reported)   No facility-administered encounter medications on file as of 10/12/2020.    Allergies (verified) Patient has no known allergies.   History: Past Medical History:  Diagnosis Date  . Anxiety and depression   . Herpes zoster 03/2017   R side of abdomen  . Hypercholesterolemia 06/2020   TLC  . Hypokalemia    History reviewed. No pertinent surgical history. Family History  Problem Relation Age of Onset  . Mental illness Mother   . Arthritis Mother   . Hypertension Mother   . Stroke Mother   . Diabetes Mother   . Mental illness Father   . Arthritis Father   . Hypertension Father   . Stroke Father   . Diabetes Father    Social History   Socioeconomic History  . Marital status: Married    Spouse name: Not on file  . Number of children: Not on file  . Years of education: Not on file  . Highest education level: Not on file  Occupational History  . Occupation: retired  Tobacco Use  . Smoking status: Never Smoker  . Smokeless tobacco: Never Used  Substance and Sexual Activity  . Alcohol use: No  . Drug use: No  . Sexual activity: Not  on file  Other Topics Concern  . Not on file  Social History Narrative   Married, 3 sons.   Housewife.   10th grade education.   No T/A/Ds.   Social Determinants of Health   Financial Resource Strain: Low Risk   . Difficulty of Paying Living Expenses: Not hard at all  Food Insecurity: No Food Insecurity  . Worried About Programme researcher, broadcasting/film/video in the Last Year: Never true  . Ran Out of Food in the Last Year: Never true  Transportation Needs: No Transportation  Needs  . Lack of Transportation (Medical): No  . Lack of Transportation (Non-Medical): No  Physical Activity: Inactive  . Days of Exercise per Week: 0 days  . Minutes of Exercise per Session: 0 min  Stress: Stress Concern Present  . Feeling of Stress : To some extent  Social Connections: Moderately Integrated  . Frequency of Communication with Friends and Family: More than three times a week  . Frequency of Social Gatherings with Friends and Family: Once a week  . Attends Religious Services: More than 4 times per year  . Active Member of Clubs or Organizations: No  . Attends Banker Meetings: Never  . Marital Status: Married    Tobacco Counseling Counseling given: Not Answered   Clinical Intake:  Pre-visit preparation completed: Yes  Pain : 0-10 Pain Score: 7  Pain Type: Chronic pain Pain Location: Back Pain Onset: More than a month ago Pain Frequency: Intermittent Pain Relieving Factors: Tylenol Arthritis  Pain Relieving Factors: Tylenol Arthritis  Nutritional Status: BMI 25 -29 Overweight Nutritional Risks: None Diabetes: No  How often do you need to have someone help you when you read instructions, pamphlets, or other written materials from your doctor or pharmacy?: 1 - Never  Diabetic?No  Interpreter Needed?: No  Information entered by :: Thomasenia Sales LPN   Activities of Daily Living In your present state of health, do you have any difficulty performing the following activities: 10/12/2020 07/11/2020  Hearing? N N  Vision? N N  Difficulty concentrating or making decisions? Y N  Comment occasionally -  Walking or climbing stairs? N N  Dressing or bathing? N N  Doing errands, shopping? N N  Preparing Food and eating ? N -  Using the Toilet? N -  In the past six months, have you accidently leaked urine? Y -  Comment occasionally -  Do you have problems with loss of bowel control? N -  Managing your Medications? N -  Managing your Finances? N  -  Housekeeping or managing your Housekeeping? N -  Some recent data might be hidden    Patient Care Team: Jeoffrey Massed, MD as PCP - General (Family Medicine)  Indicate any recent Medical Services you may have received from other than [facility] providers in the past year (date may be approximate).     Assessment:   This is a routine wellness examination for Diana Pittman.  Hearing/Vision screen  Hearing Screening   125Hz  250Hz  500Hz  1000Hz  2000Hz  3000Hz  4000Hz  6000Hz  8000Hz   Right ear:           Left ear:           Comments: No issues  Vision Screening Comments: Wears glasses Last eye exam-07/2020  Dietary issues and exercise activities discussed: Current Exercise Habits: The patient does not participate in regular exercise at present, Exercise limited by: None identified  Goals Addressed  This Visit's Progress   . Patient Stated       Drink more water      Depression Screen PHQ 2/9 Scores 10/12/2020 07/11/2020 01/05/2020 06/26/2019 06/20/2018 06/21/2017 12/20/2016  PHQ - 2 Score 1 3 4 1 5 4 4   PHQ- 9 Score - 11 14 4 12 13 12     Fall Risk Fall Risk  10/12/2020 07/11/2020 06/26/2019  Falls in the past year? 0 0 0  Number falls in past yr: 0 0 0  Injury with Fall? 0 0 0  Follow up - Falls evaluation completed Falls evaluation completed    FALL RISK PREVENTION PERTAINING TO THE HOME:  Any stairs in or around the home? No  Home free of loose throw rugs in walkways, pet beds, electrical cords, etc? Yes  Adequate lighting in your home to reduce risk of falls? Yes   ASSISTIVE DEVICES UTILIZED TO PREVENT FALLS:  Life alert? No  Use of a cane, walker or w/c? No  Grab bars in the bathroom? Yes  Shower chair or bench in shower? No  Elevated toilet seat or a handicapped toilet? No   TIMED UP AND GO:  Was the test performed? No . Phone visit   Cognitive Function:Normal cognitive status assessed by  this Nurse Health Advisor. No abnormalities found.           Immunizations Immunization History  Administered Date(s) Administered  . Fluad Quad(high Dose 65+) 04/11/2020  . Pneumococcal Polysaccharide-23 04/11/2020  . Td 05/15/2011    TDAP status: Up to date  Flu Vaccine status: Up to date  Pneumococcal vaccine status: Up to date  Covid-19 vaccine status: Declined, Education has been provided regarding the importance of this vaccine but patient still declined. Advised may receive this vaccine at local pharmacy or Health Dept.or vaccine clinic. Aware to provide a copy of the vaccination record if obtained from local pharmacy or Health Dept. Verbalized acceptance and understanding.  Qualifies for Shingles Vaccine? Yes   Zostavax completed No   Shingrix Completed?: No.    Education has been provided regarding the importance of this vaccine. Patient has been advised to call insurance company to determine out of pocket expense if they have not yet received this vaccine. Advised may also receive vaccine at local pharmacy or Health Dept. Verbalized acceptance and understanding.  Screening Tests Health Maintenance  Topic Date Due  . COLONOSCOPY (Pts 45-74yrs Insurance coverage will need to be confirmed)  Never done  . MAMMOGRAM  Never done  . Zoster Vaccines- Shingrix (1 of 2) Never done  . DEXA SCAN  Never done  . COVID-19 Vaccine (1) 10/28/2020 (Originally 10/15/1960)  . INFLUENZA VACCINE  12/12/2020  . PNA vac Low Risk Adult (2 of 2 - PCV13) 04/11/2021  . TETANUS/TDAP  05/14/2021  . HPV VACCINES  Aged Out  . Hepatitis C Screening  Discontinued    Health Maintenance  Health Maintenance Due  Topic Date Due  . COLONOSCOPY (Pts 45-22yrs Insurance coverage will need to be confirmed)  Never done  . MAMMOGRAM  Never done  . Zoster Vaccines- Shingrix (1 of 2) Never done  . DEXA SCAN  Never done    Colorectal cancer screening: Type of screening: Cologuard. Completed 07/2019. Repeat every 3 years  Mammogram status: Due-Declined  Bone  density status: Due-Declined  Lung Cancer Screening: (Low Dose CT Chest recommended if Age 39-80 years, 30 pack-year currently smoking OR have quit w/in 15years.) does not qualify.     Additional Screening:  Hepatitis C Screening: does qualify; pt previously declined  Vision Screening: Recommended annual ophthalmology exams for early detection of glaucoma and other disorders of the eye. Is the patient up to date with their annual eye exam?  Yes  Who is the provider or what is the name of the office in which the patient attends annual eye exams? Patient unsure of name   Dental Screening: Recommended annual dental exams for proper oral hygiene  Community Resource Referral / Chronic Care Management: CRR required this visit?  No   CCM required this visit?  No      Plan:     I have personally reviewed and noted the following in the patient's chart:   . Medical and social history . Use of alcohol, tobacco or illicit drugs  . Current medications and supplements including opioid prescriptions. Patient is not currently taking opioid prescriptions. . Functional ability and status . Nutritional status . Physical activity . Advanced directives . List of other physicians . Hospitalizations, surgeries, and ER visits in previous 12 months . Vitals . Screenings to include cognitive, depression, and falls . Referrals and appointments  In addition, I have reviewed and discussed with patient certain preventive protocols, quality metrics, and best practice recommendations. A written personalized care plan for preventive services as well as general preventive health recommendations were provided to patient.   Due to this being a telephonic visit, the after visit summary with patients personalized plan was offered to patient via mail or my-chart.  Patient would like to access on my-chart.   Roanna RaiderMartha A Morse Brueggemann, LPN   1/6/10966/05/2020  Nurse Health Advisor  Nurse Notes: none

## 2020-10-12 NOTE — Patient Instructions (Signed)
Diana Pittman , Thank you for taking time to complete your Medicare Wellness Visit. I appreciate your ongoing commitment to your health goals. Please review the following plan we discussed and let me know if I can assist you in the future.   Screening recommendations/referrals: Colonoscopy: Completed Cologuard 07/2019-Due 07/2022 Mammogram: Due -Declined today. Please call the office when you are ready to schedule. Bone Density: Due -Declined today. Please call the office when you are ready to schedule. Recommended yearly ophthalmology/optometry visit for glaucoma screening and checkup Recommended yearly dental visit for hygiene and checkup  Vaccinations: Influenza vaccine: Up to date Pneumococcal vaccine: Up to date Tdap vaccine: Up to date Shingles vaccine: Discuss with pharmacy  Covid-19:Declined  Advanced directives: Information mailed today  Conditions/risks identified: See problem list  Next appointment: Follow up in one year for your annual wellness visit    Preventive Care 65 Years and Older, Female Preventive care refers to lifestyle choices and visits with your health care provider that can promote health and wellness. What does preventive care include?  A yearly physical exam. This is also called an annual well check.  Dental exams once or twice a year.  Routine eye exams. Ask your health care provider how often you should have your eyes checked.  Personal lifestyle choices, including:  Daily care of your teeth and gums.  Regular physical activity.  Eating a healthy diet.  Avoiding tobacco and drug use.  Limiting alcohol use.  Practicing safe sex.  Taking low-dose aspirin every day.  Taking vitamin and mineral supplements as recommended by your health care provider. What happens during an annual well check? The services and screenings done by your health care provider during your annual well check will depend on your age, overall health, lifestyle risk  factors, and family history of disease. Counseling  Your health care provider may ask you questions about your:  Alcohol use.  Tobacco use.  Drug use.  Emotional well-being.  Home and relationship well-being.  Sexual activity.  Eating habits.  History of falls.  Memory and ability to understand (cognition).  Work and work Astronomer.  Reproductive health. Screening  You may have the following tests or measurements:  Height, weight, and BMI.  Blood pressure.  Lipid and cholesterol levels. These may be checked every 5 years, or more frequently if you are over 60 years old.  Skin check.  Lung cancer screening. You may have this screening every year starting at age 6 if you have a 30-pack-year history of smoking and currently smoke or have quit within the past 15 years.  Fecal occult blood test (FOBT) of the stool. You may have this test every year starting at age 54.  Flexible sigmoidoscopy or colonoscopy. You may have a sigmoidoscopy every 5 years or a colonoscopy every 10 years starting at age 32.  Hepatitis C blood test.  Hepatitis B blood test.  Sexually transmitted disease (STD) testing.  Diabetes screening. This is done by checking your blood sugar (glucose) after you have not eaten for a while (fasting). You may have this done every 1-3 years.  Bone density scan. This is done to screen for osteoporosis. You may have this done starting at age 48.  Mammogram. This may be done every 1-2 years. Talk to your health care provider about how often you should have regular mammograms. Talk with your health care provider about your test results, treatment options, and if necessary, the need for more tests. Vaccines  Your health care provider may  recommend certain vaccines, such as:  Influenza vaccine. This is recommended every year.  Tetanus, diphtheria, and acellular pertussis (Tdap, Td) vaccine. You may need a Td booster every 10 years.  Zoster vaccine. You  may need this after age 66.  Pneumococcal 13-valent conjugate (PCV13) vaccine. One dose is recommended after age 72.  Pneumococcal polysaccharide (PPSV23) vaccine. One dose is recommended after age 63. Talk to your health care provider about which screenings and vaccines you need and how often you need them. This information is not intended to replace advice given to you by your health care provider. Make sure you discuss any questions you have with your health care provider. Document Released: 05/27/2015 Document Revised: 01/18/2016 Document Reviewed: 03/01/2015 Elsevier Interactive Patient Education  2017 [place] Prevention in the Home Falls can cause injuries. They can happen to people of all ages. There are many things you can do to make your home safe and to help prevent falls. What can I do on the outside of my home?  Regularly fix the edges of walkways and driveways and fix any cracks.  Remove anything that might make you trip as you walk through a door, such as a raised step or threshold.  Trim any bushes or trees on the path to your home.  Use bright outdoor lighting.  Clear any walking paths of anything that might make someone trip, such as rocks or tools.  Regularly check to see if handrails are loose or broken. Make sure that both sides of any steps have handrails.  Any raised decks and porches should have guardrails on the edges.  Have any leaves, snow, or ice cleared regularly.  Use sand or salt on walking paths during winter.  Clean up any spills in your garage right away. This includes oil or grease spills. What can I do in the bathroom?  Use night lights.  Install grab bars by the toilet and in the tub and shower. Do not use towel bars as grab bars.  Use non-skid mats or decals in the tub or shower.  If you need to sit down in the shower, use a plastic, non-slip stool.  Keep the floor dry. Clean up any water that spills on the floor as soon as  it happens.  Remove soap buildup in the tub or shower regularly.  Attach bath mats securely with double-sided non-slip rug tape.  Do not have throw rugs and other things on the floor that can make you trip. What can I do in the bedroom?  Use night lights.  Make sure that you have a light by your bed that is easy to reach.  Do not use any sheets or blankets that are too big for your bed. They should not hang down onto the floor.  Have a firm chair that has side arms. You can use this for support while you get dressed.  Do not have throw rugs and other things on the floor that can make you trip. What can I do in the kitchen?  Clean up any spills right away.  Avoid walking on wet floors.  Keep items that you use a lot in easy-to-reach places.  If you need to reach something above you, use a strong step stool that has a grab bar.  Keep electrical cords out of the way.  Do not use floor polish or wax that makes floors slippery. If you must use wax, use non-skid floor wax.  Do not have throw rugs and  other things on the floor that can make you trip. What can I do with my stairs?  Do not leave any items on the stairs.  Make sure that there are handrails on both sides of the stairs and use them. Fix handrails that are broken or loose. Make sure that handrails are as long as the stairways.  Check any carpeting to make sure that it is firmly attached to the stairs. Fix any carpet that is loose or worn.  Avoid having throw rugs at the top or bottom of the stairs. If you do have throw rugs, attach them to the floor with carpet tape.  Make sure that you have a light switch at the top of the stairs and the bottom of the stairs. If you do not have them, ask someone to add them for you. What else can I do to help prevent falls?  Wear shoes that:  Do not have high heels.  Have rubber bottoms.  Are comfortable and fit you well.  Are closed at the toe. Do not wear sandals.  If  you use a stepladder:  Make sure that it is fully opened. Do not climb a closed stepladder.  Make sure that both sides of the stepladder are locked into place.  Ask someone to hold it for you, if possible.  Clearly mark and make sure that you can see:  Any grab bars or handrails.  First and last steps.  Where the edge of each step is.  Use tools that help you move around (mobility aids) if they are needed. These include:  Canes.  Walkers.  Scooters.  Crutches.  Turn on the lights when you go into a dark area. Replace any light bulbs as soon as they burn out.  Set up your furniture so you have a clear path. Avoid moving your furniture around.  If any of your floors are uneven, fix them.  If there are any pets around you, be aware of where they are.  Review your medicines with your doctor. Some medicines can make you feel dizzy. This can increase your chance of falling. Ask your doctor what other things that you can do to help prevent falls. This information is not intended to replace advice given to you by your health care provider. Make sure you discuss any questions you have with your health care provider. Document Released: 02/24/2009 Document Revised: 10/06/2015 Document Reviewed: 06/04/2014 Elsevier Interactive Patient Education  2017 [employer].

## 2020-10-20 ENCOUNTER — Other Ambulatory Visit: Payer: Self-pay | Admitting: Family Medicine

## 2020-11-24 ENCOUNTER — Other Ambulatory Visit: Payer: Self-pay | Admitting: Family Medicine

## 2020-12-24 ENCOUNTER — Other Ambulatory Visit: Payer: Self-pay | Admitting: Family Medicine

## 2021-01-09 ENCOUNTER — Ambulatory Visit: Payer: Medicare Other | Admitting: Family Medicine

## 2021-01-17 ENCOUNTER — Other Ambulatory Visit: Payer: Self-pay | Admitting: Family Medicine

## 2021-01-19 NOTE — Telephone Encounter (Signed)
Requesting:xananx Contract:06/26/19 UDS:06/26/19 Last Visit:07/26/20 Next Visit:01/26/21 Last Refill:07/18/20 (120,5)  Please Advise

## 2021-01-26 ENCOUNTER — Ambulatory Visit (INDEPENDENT_AMBULATORY_CARE_PROVIDER_SITE_OTHER): Payer: Medicare Other | Admitting: Family Medicine

## 2021-01-26 ENCOUNTER — Other Ambulatory Visit: Payer: Self-pay

## 2021-01-26 ENCOUNTER — Encounter: Payer: Self-pay | Admitting: Family Medicine

## 2021-01-26 VITALS — BP 91/61 | HR 72 | Temp 98.1°F | Resp 16 | Ht 62.0 in | Wt 137.0 lb

## 2021-01-26 DIAGNOSIS — E876 Hypokalemia: Secondary | ICD-10-CM | POA: Diagnosis not present

## 2021-01-26 MED ORDER — FLUTICASONE PROPIONATE 50 MCG/ACT NA SUSP: NASAL | 11 refills | Status: DC

## 2021-01-26 MED ORDER — FEXOFENADINE-PSEUDOEPHED ER 60-120 MG PO TB12: ORAL_TABLET | ORAL | 3 refills | Status: DC

## 2021-01-26 NOTE — Progress Notes (Signed)
OFFICE VISIT  01/26/2021  CC:  Chief Complaint  Patient presents with   Follow-up    RCI, 71mo. Pt is fasting   HPI:    Patient is a 65 y.o. Caucasian female who presents for 6 mo f/u GAD and hx of MDD with recent grief rxn. A/P as of last visit: "1) Grief rxn, superimposed on chronic anx/dep. She is gradually getting through this.  Gave emotional support. She declined any med changes/additions today.   2) Hypokalemia, mild.   Recheck bmet today since being on low dose rx potassium supplement lately."  INTERIM HX: Still struggling a lot with grief over loss of her son 7 mo ago. Cries daily, often lays in bed, has no appetite, feeling hopeless and guilty. No energy, poor focus.  No SI or HI.  Struggling with relationship with husband. She gets good benefit from relationship with her cousin. Still some estranged family members.    She takes her potassium supplement, citalopram, and alpraz as rx'd.  PMP AWARE reviewed today: most recent rx for alprazolam was filled 01/19/21, # 120, rx by me. No red flags.  Past Medical History:  Diagnosis Date   Anxiety and depression    Herpes zoster 03/2017   R side of abdomen   Hypercholesterolemia 06/2020   TLC   Hypokalemia     History reviewed. No pertinent surgical history.  Outpatient Medications Prior to Visit  Medication Sig Dispense Refill   alprazolam (XANAX) 2 MG tablet TAKE ONE TABLET BY MOUTH FOUR TIMES DAILY AS NEEDED FOR ANXIETY 120 tablet 5   citalopram (CELEXA) 40 MG tablet Take 1 tablet (40 mg total) by mouth daily. 90 tablet 3   multivitamin-lutein (OCUVITE-LUTEIN) CAPS capsule Take 1 capsule by mouth daily.     potassium chloride SA (KLOR-CON) 20 MEQ tablet Take 1 tablet (20 mEq total) by mouth daily. 60 tablet 5   fluticasone (FLONASE) 50 MCG/ACT nasal spray USE 2 SPRAYS IN EACH NOSTRIL ONCE DAILY.  SHAKE GENTLY BEFORE USING. 16 g 1   fexofenadine-pseudoephedrine (ALLEGRA-D) 60-120 MG 12 hr tablet 1 tab po qAM for  allergy symptoms (Patient not taking: No sig reported) 90 tablet 3   No facility-administered medications prior to visit.    No Known Allergies  ROS As per HPI  PE: Vitals with BMI 01/26/2021 10/12/2020 07/26/2020  Height 5\' 2"  5\' 2"  5\' 2"   Weight 137 lbs 146 lbs 146 lbs 6 oz  BMI 25.05 26.7 26.77  Systolic 91 - 110  Diastolic 61 - 60  Pulse 72 - 75   Gen: Alert, well appearing.  Patient is oriented to person, place, time, and situation. AFFECT: flat, morose, crying some, lucid thought and speech. No further exam today.  LABS:  No results found for: TSH Lab Results  Component Value Date   WBC 6.8 07/11/2020   HGB 14.2 07/11/2020   HCT 41.4 07/11/2020   MCV 85.0 07/11/2020   PLT 241 07/11/2020   Lab Results  Component Value Date   CREATININE 0.80 07/26/2020   BUN 8 07/26/2020   NA 138 07/26/2020   K 4.0 07/26/2020   CL 105 07/26/2020   CO2 25 07/26/2020   Lab Results  Component Value Date   ALT 11 07/11/2020   AST 19 07/11/2020   BILITOT 0.6 07/11/2020   Lab Results  Component Value Date   CHOL 233 (H) 07/11/2020   Lab Results  Component Value Date   HDL 65 07/11/2020   Lab Results  Component  Value Date   LDLCALC 148 (H) 07/11/2020   Lab Results  Component Value Date   TRIG 92 07/11/2020   Lab Results  Component Value Date   CHOLHDL 3.6 07/11/2020   IMPRESSION AND PLAN:  1) MDD, GAD, with superimposed prolonged grief rxn. She wants to continue on citalopram 40 qd and alpraz 2mg  qid w/out changes. She is struggling but does express some optimism for the future. No alprazolam rx needed today. Update CSC today.  2) Hx of hypokalemia: on K supp. BMET today.  Spent 30 min with pt today reviewing HPI, reviewing relevant past history, doing exam, reviewing and discussing lab and imaging data, and formulating plans.  An After Visit Summary was printed and given to the patient.  FOLLOW UP: Return in about 6 months (around 07/26/2021) for annual CPE  (fasting).  Signed:  07/28/2021, MD           01/26/2021

## 2021-01-27 LAB — BASIC METABOLIC PANEL
BUN: 11 mg/dL (ref 6–23)
CO2: 24 mEq/L (ref 19–32)
Calcium: 9.7 mg/dL (ref 8.4–10.5)
Chloride: 107 mEq/L (ref 96–112)
Creatinine, Ser: 1.08 mg/dL (ref 0.40–1.20)
GFR: 53.24 mL/min — ABNORMAL LOW (ref >60.00)
Glucose, Bld: 89 mg/dL (ref 70–99)
Potassium: 4.2 mEq/L (ref 3.5–5.1)
Sodium: 138 mEq/L (ref 135–145)

## 2021-02-16 ENCOUNTER — Telehealth: Payer: Self-pay | Admitting: Family Medicine

## 2021-02-16 MED ORDER — HYDROCODONE-ACETAMINOPHEN 5-325 MG PO TABS: 1.0000 | ORAL_TABLET | Freq: Four times a day (QID) | ORAL | 0 refills | Status: DC | PRN

## 2021-02-16 NOTE — Telephone Encounter (Signed)
Please advise if anything different to do

## 2021-02-16 NOTE — Telephone Encounter (Signed)
Rx sent for vicodin to use short term.  Stop all otc pain meds for now. Nees to get this tooth pain addressed with a dentist.  Dpes she have one?

## 2021-02-16 NOTE — Telephone Encounter (Signed)
Pt advised rx sent, she has a dentist but cannot afford to go back.

## 2021-02-16 NOTE — Telephone Encounter (Addendum)
LM for pt to returncall

## 2021-02-16 NOTE — Telephone Encounter (Signed)
Pt called noting toothache. She has taken Goody's powders, Motrin/ibuprofen and it isn't helping. Pt stated her FSA is used up and she cannot go to the dentist. Pt was advised that she would need appointment. She said they can't drive down here since they were just here 9/15. Advised that appt was not for toothache. Pt requested msg be sent to provider for assistance/recommendation.

## 2021-05-20 ENCOUNTER — Other Ambulatory Visit: Payer: Self-pay | Admitting: Family Medicine

## 2021-07-15 ENCOUNTER — Other Ambulatory Visit: Payer: Self-pay | Admitting: Family Medicine

## 2021-07-17 ENCOUNTER — Other Ambulatory Visit: Payer: Self-pay

## 2021-07-17 ENCOUNTER — Encounter: Payer: Self-pay | Admitting: Family Medicine

## 2021-07-17 ENCOUNTER — Ambulatory Visit (INDEPENDENT_AMBULATORY_CARE_PROVIDER_SITE_OTHER): Payer: Medicare Other | Admitting: Family Medicine

## 2021-07-17 VITALS — BP 97/67 | HR 87 | Temp 98.1°F | Ht 62.0 in | Wt 137.0 lb

## 2021-07-17 DIAGNOSIS — F411 Generalized anxiety disorder: Secondary | ICD-10-CM

## 2021-07-17 DIAGNOSIS — F331 Major depressive disorder, recurrent, moderate: Secondary | ICD-10-CM

## 2021-07-17 DIAGNOSIS — H8110 Benign paroxysmal vertigo, unspecified ear: Secondary | ICD-10-CM | POA: Diagnosis not present

## 2021-07-17 DIAGNOSIS — F4321 Adjustment disorder with depressed mood: Secondary | ICD-10-CM

## 2021-07-17 MED ORDER — ALPRAZOLAM 2 MG PO TABS: ORAL_TABLET | ORAL | 5 refills | Status: DC

## 2021-07-17 MED ORDER — FEXOFENADINE-PSEUDOEPHED ER 60-120 MG PO TB12: ORAL_TABLET | ORAL | 1 refills | Status: DC

## 2021-07-17 NOTE — Progress Notes (Signed)
OFFICE VISIT ? ?07/17/2021 ? ?CC:  ?Chief Complaint  ?Patient presents with  ? Hot Flashes  ? Chills  ? ? ?Patient is a 66 y.o. female who presents for 6 mo f/u anxiety and also "hot flashes and chills". ?A/P as of last visit: ?"1) MDD, GAD, with superimposed prolonged grief rxn. ?She wants to continue on citalopram 40 qd and alpraz 2mg  qid w/out changes. ?She is struggling but does express some optimism for the future. ?No alprazolam rx needed today. ?Update CSC today. ?2) Hx of hypokalemia: on K supp. ?BMET today." ? ?HPI: ?Describes having spinning sensation upon arising to get up out of bed about 10 days ago.  Had an episode later again that day.  Both positionally induced.  Had some feeling of getting very hot with each 1, laste somewhere around 30 to 60 seconds.  No nausea or vomiting. ?Last night she had a episode of abdominal pain and feeling nauseous.  She ate turnip greens at [employer] not long before going to bed.  She did vomit and felt better.  Upon waking up this morning she felt good. ? ?She has ongoing depression, grieving for her son who died a year ago.  She does not do much in the way of activity at all.  She gets out and goes to church but otherwise seems to be laying around.  Admits she is significantly deconditioned.  Denies chest pain.  She feels tired and has some feeling of being winded when she gets up and moves around to do things like laundry.  This goes away promptly upon rest. ? ?Her chronic anxiety is fairly well controlled with use of alprazolam chronically.  Also on citalopram 40 mg a day.  She describes some problem with chronic arguing with her husband but there certainly is no verbal abuse or physical abuse going on. ?Denies SI or HI. ? ?PMP AWARE reviewed today: most recent rx for alprazolam was filled 06/12/21, # 120, rx by me. ?No red flags. ? ?ROS as above, plus--> no fevers,  no wheezing, no cough, she reports chronic sinus HAs.  No tinnitus. No rashes, no melena/hematochezia.   No polyuria or polydipsia.  No myalgias or arthralgias.  No focal weakness, paresthesias, or tremors.  No acute vision or hearing abnormalities.  No dysuria or unusual/new urinary urgency or frequency.  No recent changes in lower legs. ?No n/v/d or abd pain.  No palpitations.   ? ?Past Medical History:  ?Diagnosis Date  ? Anxiety and depression   ? Herpes zoster 03/2017  ? R side of abdomen  ? Hypercholesterolemia 06/2020  ? TLC  ? Hypokalemia   ? ? ?History reviewed. No pertinent surgical history. ? ?Outpatient Medications Prior to Visit  ?Medication Sig Dispense Refill  ? alprazolam (XANAX) 2 MG tablet TAKE ONE TABLET BY MOUTH FOUR TIMES DAILY AS NEEDED FOR ANXIETY 120 tablet 5  ? citalopram (CELEXA) 40 MG tablet TAKE ONE TABLET BY MOUTH DAILY. 90 tablet 0  ? fexofenadine-pseudoephedrine (ALLEGRA-D) 60-120 MG 12 hr tablet 1 tab po qAM for allergy symptoms 90 tablet 3  ? fluticasone (FLONASE) 50 MCG/ACT nasal spray 2 sprays each nostril qd 16 g 11  ? HYDROcodone-acetaminophen (NORCO/VICODIN) 5-325 MG tablet Take 1-2 tablets by mouth every 6 (six) hours as needed for moderate pain. 30 tablet 0  ? multivitamin-lutein (OCUVITE-LUTEIN) CAPS capsule Take 1 capsule by mouth daily.    ? potassium chloride SA (KLOR-CON) 20 MEQ tablet Take 1 tablet (20 mEq total) by  mouth daily. 60 tablet 5  ? ?No facility-administered medications prior to visit.  ? ? ?No Known Allergies ? ?ROS ?As per HPI ? ?PE: ?Vitals with BMI 07/17/2021 01/26/2021 10/12/2020  ?Height 5\' 2"  5\' 2"  5\' 2"   ?Weight 137 lbs 137 lbs 146 lbs  ?BMI 25.05 25.05 26.7  ?Systolic 97 91 -  ?Diastolic 67 61 -  ?Pulse 87 72 -  ?02 sat 99% RA ? ? ?Physical Exam ? ?Gen: Alert, well appearing.  Patient is oriented to person, place, time, and situation. ?AFFECT: flat, as per her usual.  Lucid thought and speech. ? : no injection, icteris, swelling, or exudate.  EOMI, PERRLA. ?Mouth: lips without lesion/swelling.  Oral mucosa pink and moist. Oropharynx without erythema,  exudate, or swelling.  ?Neck - No masses or thyromegaly or limitation in range of motion ?CV: RRR, no m/r/g.   ?LUNGS: CTA bilat, nonlabored resps, good aeration in all lung fields. ?EXT: no clubbing or cyanosis.  no edema.  ?Neuro: CN 2-12 intact bilaterally, strength 5/5 in proximal and distal upper extremities and lower extremities bilaterally.  No sensory deficits.  No tremor.  No disdiadochokinesis.  No ataxia.  Upper extremity and lower extremity DTRs symmetric.  No pronator drift. ? ? ? ?LABS:  ?Last CBC ?Lab Results  ?Component Value Date  ? WBC 6.8 07/11/2020  ? HGB 14.2 07/11/2020  ? HCT 41.4 07/11/2020  ? MCV 85.0 07/11/2020  ? MCH 29.2 07/11/2020  ? RDW 12.7 07/11/2020  ? PLT 241 07/11/2020  ? ?Last metabolic panel ?Lab Results  ?Component Value Date  ? GLUCOSE 89 01/26/2021  ? NA 138 01/26/2021  ? K 4.2 01/26/2021  ? CL 107 01/26/2021  ? CO2 24 01/26/2021  ? BUN 11 01/26/2021  ? CREATININE 1.08 01/26/2021  ? CALCIUM 9.7 01/26/2021  ? PROT 7.7 07/11/2020  ? BILITOT 0.6 07/11/2020  ? AST 19 07/11/2020  ? ALT 11 07/11/2020  ? ? ?IMPRESSION AND PLAN: ? ?#1 BPPV: Resolved. ?Epley maneuvers explained and handout given. ? ?#2 chronic depression with significant prolonged grief response.  Chronic anxiety. ?She does not want counseling.  She knows she needs to get up and move around and get out with people.  She is going to try. ?Continue citalopram 40 mg a day and alprazolam 2 mg 4 times daily as needed, #120, refill x5. ? ?An After Visit Summary was printed and given to the patient. ? ?FOLLOW UP: No follow-ups on file. ? ?Signed:  07/13/2020, MD           07/17/2021 ? ?

## 2021-07-17 NOTE — Telephone Encounter (Signed)
Pt has appt today 3/6 ?

## 2021-07-20 ENCOUNTER — Encounter: Payer: Medicare Other | Admitting: Family Medicine

## 2021-08-10 ENCOUNTER — Telehealth: Payer: Self-pay | Admitting: Family Medicine

## 2021-08-10 ENCOUNTER — Encounter: Payer: Medicare Other | Admitting: Family Medicine

## 2021-08-10 NOTE — Telephone Encounter (Signed)
Pt states that she is doing better she will make appt with her dentist ?

## 2021-08-10 NOTE — Telephone Encounter (Signed)
FYI: ?Pt said she's really needing antibiotics, she's having tooth pain. ? ?Pt said she asked her Dentist, they wouldn't give her antibiotics for her tooth ache. ?Pt husband will be coming in today 08/10/2021 ? ? ? ? ?

## 2021-09-13 ENCOUNTER — Other Ambulatory Visit: Payer: Self-pay | Admitting: Family Medicine

## 2021-10-14 ENCOUNTER — Ambulatory Visit (INDEPENDENT_AMBULATORY_CARE_PROVIDER_SITE_OTHER): Payer: Medicare Other

## 2021-10-14 DIAGNOSIS — Z0000 Encounter for general adult medical examination without abnormal findings: Secondary | ICD-10-CM

## 2021-10-14 DIAGNOSIS — Z13820 Encounter for screening for osteoporosis: Secondary | ICD-10-CM

## 2021-10-14 DIAGNOSIS — Z1231 Encounter for screening mammogram for malignant neoplasm of breast: Secondary | ICD-10-CM

## 2021-10-14 MED ORDER — SHINGRIX 50 MCG/0.5ML IM SUSR: 0.5000 mL | Freq: Once | INTRAMUSCULAR | 1 refills | Status: AC

## 2021-10-14 MED ORDER — TETANUS-DIPHTH-ACELL PERTUSSIS 5-2.5-18.5 LF-MCG/0.5 IM SUSP: 0.5000 mL | Freq: Once | INTRAMUSCULAR | 0 refills | Status: AC

## 2021-10-14 NOTE — Progress Notes (Signed)
Subjective:   Diana Pittman is a 66 y.o. female who presents for Medicare Annual (Subsequent) preventive examination. I connected with  Geraldo Docker on 10/14/21 by a audio enabled telemedicine application and verified that I am speaking with the correct person using two identifiers.  Patient Location: Home  Provider Location: Home Office  I discussed the limitations of evaluation and management by telemedicine. The patient expressed understanding and agreed to proceed.   Review of Systems    Defer to PCP Cardiac Risk Factors include: advanced age (>24men, >12 women)     Objective:    There were no vitals filed for this visit. There is no height or weight on file to calculate BMI.     10/14/2021    9:11 AM 10/12/2020   12:48 PM 04/01/2017    2:58 PM  Advanced Directives  Does Patient Have a Medical Advance Directive? No No No  Does patient want to make changes to medical advance directive?  Yes (MAU/Ambulatory/Procedural Areas - Information given)   Would patient like information on creating a medical advance directive? Yes (MAU/Ambulatory/Procedural Areas - Information given)      Current Medications (verified) Outpatient Encounter Medications as of 10/14/2021  Medication Sig   alprazolam (XANAX) 2 MG tablet TAKE ONE TABLET BY MOUTH FOUR TIMES DAILY AS NEEDED FOR ANXIETY   citalopram (CELEXA) 40 MG tablet TAKE ONE TABLET BY MOUTH DAILY.   fexofenadine-pseudoephedrine (ALLEGRA-D) 60-120 MG 12 hr tablet 1 tab po qAM for allergy symptoms   fluticasone (FLONASE) 50 MCG/ACT nasal spray 2 sprays each nostril qd   HYDROcodone-acetaminophen (NORCO/VICODIN) 5-325 MG tablet Take 1-2 tablets by mouth every 6 (six) hours as needed for moderate pain.   multivitamin-lutein (OCUVITE-LUTEIN) CAPS capsule Take 1 capsule by mouth daily.   potassium chloride SA (KLOR-CON M) 20 MEQ tablet TAKE ONE TABLET BY MOUTH ONCE DAILY   No facility-administered encounter medications on file as of  10/14/2021.    Allergies (verified) Patient has no known allergies.   History: Past Medical History:  Diagnosis Date   Anxiety and depression    Herpes zoster 03/2017   R side of abdomen   Hypercholesterolemia 06/2020   TLC   Hypokalemia    History reviewed. No pertinent surgical history. Family History  Problem Relation Age of Onset   Mental illness Mother    Arthritis Mother    Hypertension Mother    Stroke Mother    Diabetes Mother    Mental illness Father    Arthritis Father    Hypertension Father    Stroke Father    Diabetes Father    Social History   Socioeconomic History   Marital status: Married    Spouse name: Not on file   Number of children: Not on file   Years of education: Not on file   Highest education level: Not on file  Occupational History   Occupation: retired  Tobacco Use   Smoking status: Never   Smokeless tobacco: Never  Substance and Sexual Activity   Alcohol use: No   Drug use: No   Sexual activity: Not on file  Other Topics Concern   Not on file  Social History Narrative   Married, 3 sons.   Housewife.   10th grade education.   No T/A/Ds.   Social Determinants of Health   Financial Resource Strain: Low Risk    Difficulty of Paying Living Expenses: Not hard at all  Food Insecurity: No Food Insecurity   Worried About Running  Out of Food in the Last Year: Never true   Ran Out of Food in the Last Year: Never true  Transportation Needs: No Transportation Needs   Lack of Transportation (Medical): No   Lack of Transportation (Non-Medical): No  Physical Activity: Inactive   Days of Exercise per Week: 0 days   Minutes of Exercise per Session: 0 min  Stress: Stress Concern Present   Feeling of Stress : To some extent  Social Connections: Moderately Integrated   Frequency of Communication with Friends and Family: More than three times a week   Frequency of Social Gatherings with Friends and Family: Once a week   Attends Religious  Services: More than 4 times per year   Active Member of [employer] or Organizations: No   Attends Engineer, structural: Never   Marital Status: Married    Tobacco Counseling Counseling given: Not Answered   Clinical Intake:  Pre-visit preparation completed: Yes  Pain : No/denies pain     BMI - recorded: 25 Nutritional Status: BMI 25 -29 Overweight Nutritional Risks: Unintentional weight loss Diabetes: No  How often do you need to have someone help you when you read instructions, pamphlets, or other written materials from your doctor or pharmacy?: 1 - Never What is the last grade level you completed in school?: 9th  Diabetic?No  Interpreter Needed?: No  Information entered by :: Toria   Activities of Daily Living    10/14/2021    9:06 AM  In your present state of health, do you have any difficulty performing the following activities:  Hearing? 0  Vision? 0  Difficulty concentrating or making decisions? 0  Walking or climbing stairs? 0  Dressing or bathing? 0  Doing errands, shopping? 0  Preparing Food and eating ? N  Using the Toilet? N  Managing your Medications? N  Managing your Finances? N  Housekeeping or managing your Housekeeping? N    Patient Care Team: Jeoffrey Massed, MD as PCP - General (Family Medicine)  Indicate any recent Medical Services you may have received from other than [facility] providers in the past year (date may be approximate).     Assessment:   This is a routine wellness examination for Diana Pittman.  Hearing/Vision screen No results found.  Dietary issues and exercise activities discussed: Current Exercise Habits: The patient does not participate in regular exercise at present, Exercise limited by: None identified   Goals Addressed             This Visit's Progress    Manage My Emotions        Depression Screen    07/17/2021    3:12 PM 01/26/2021   11:38 AM 10/12/2020   12:52 PM 07/11/2020   11:34 AM 01/05/2020   11:34 AM  06/26/2019   11:16 AM 06/20/2018    3:07 PM  PHQ 2/9 Scores  PHQ - 2 Score PHQ- 9 Score Fall Risk    10/14/2021    9:13 AM 07/17/2021    2:19 PM 10/12/2020   12:50 PM 07/11/2020   11:10 AM 06/26/2019   11:16 AM  Fall Risk   Falls in the past year? 0 0 0 0 0  Number falls in past yr: 0 0 0 0 0  Injury with Fall? 0 0 0 0 0  Follow up Falls evaluation completed   Falls evaluation completed Falls evaluation completed  FALL RISK PREVENTION PERTAINING TO THE HOME:  Any stairs in or around the home? No  Home free of loose throw rugs in walkways, pet beds, electrical cords, etc? Yes  Adequate lighting in your home to reduce risk of falls? Yes   ASSISTIVE DEVICES UTILIZED TO PREVENT FALLS:  Life alert? No  Use of a cane, walker or w/c? No  Grab bars in the bathroom? Yes  Shower chair or bench in shower? No  Elevated toilet seat or a handicapped toilet? No   TIMED UP AND GO:  Was the test performed? No .   Cognitive Function:        10/14/2021    9:25 AM  6CIT Screen  What Year? 0 points  What month? 0 points  What time? 0 points  Count back from 20 0 points  Months in reverse 0 points  Repeat phrase 0 points  Total Score 0 points    Immunizations Immunization History  Administered Date(s) Administered   Fluad Quad(high Dose 65+) 04/11/2020   Pneumococcal Polysaccharide-23 04/11/2020   Td 05/15/2011    TDAP status: Due, Education has been provided regarding the importance of this vaccine. Advised may receive this vaccine at local pharmacy or Health Dept. Aware to provide a copy of the vaccination record if obtained from local pharmacy or Health Dept. Verbalized acceptance and understanding.  Flu Vaccine status: Declined, Education has been provided regarding the importance of this vaccine but patient still declined. Advised may receive this vaccine at local pharmacy or Health Dept. Aware to provide a copy of the vaccination record if  obtained from local pharmacy or Health Dept. Verbalized acceptance and understanding.  Pneumococcal vaccine status: Due, Education has been provided regarding the importance of this vaccine. Advised may receive this vaccine at local pharmacy or Health Dept. Aware to provide a copy of the vaccination record if obtained from local pharmacy or Health Dept. Verbalized acceptance and understanding.  Covid-19 vaccine status: Declined, Education has been provided regarding the importance of this vaccine but patient still declined. Advised may receive this vaccine at local pharmacy or Health Dept.or vaccine clinic. Aware to provide a copy of the vaccination record if obtained from local pharmacy or Health Dept. Verbalized acceptance and understanding.  Qualifies for Shingles Vaccine? Yes   Zostavax completed No   Shingrix Completed?: No.    Education has been provided regarding the importance of this vaccine. Patient has been advised to call insurance company to determine out of pocket expense if they have not yet received this vaccine. Advised may also receive vaccine at local pharmacy or Health Dept. Verbalized acceptance and understanding.  Screening Tests Health Maintenance  Topic Date Due   DEXA SCAN  Never done   Zoster Vaccines- Shingrix (1 of 2) 10/17/2021 (Originally 10/14/1974)   COVID-19 Vaccine (1) 10/30/2021 (Originally 04/15/1956)   Pneumonia Vaccine 83+ Years old (2 - PCV) 07/18/2022 (Originally 04/11/2021)   MAMMOGRAM  07/18/2022 (Originally 10/15/2003)   TETANUS/TDAP  07/18/2022 (Originally 05/14/2021)   INFLUENZA VACCINE  12/12/2021   Fecal DNA (Cologuard)  08/05/2022   HPV VACCINES  Aged Out   Hepatitis C Screening  Discontinued    Health Maintenance  Health Maintenance Due  Topic Date Due   DEXA SCAN  Never done    Colorectal cancer screening: Type of screening: Cologuard. Completed 08/05/19. Repeat every 3 years  Mammogram status: Ordered today. Pt provided with contact info  and advised to call to schedule appt.   Bone Density status: Ordered  today. Pt provided with contact info and advised to call to schedule appt.  Lung Cancer Screening: (Low Dose CT Chest recommended if Age 43-80 years, 30 pack-year currently smoking OR have quit w/in 15years.) does not qualify.    Additional Screening:  Hepatitis C Screening: does qualify;  Vision Screening: Recommended annual ophthalmology exams for early detection of glaucoma and other disorders of the eye. Is the patient up to date with their annual eye exam?  Yes  Who is the provider or what is the name of the office in which the patient attends annual eye exams? [facility]   Dental Screening: Recommended annual dental exams for proper oral hygiene  Community Resource Referral / Chronic Care Management: CRR required this visit?  No   CCM required this visit?  No      Plan:     I have personally reviewed and noted the following in the patient's chart:   Medical and social history Use of alcohol, tobacco or illicit drugs  Current medications and supplements including opioid prescriptions.  Functional ability and status Nutritional status Physical activity Advanced directives List of other physicians Hospitalizations, surgeries, and ER visits in previous 12 months Vitals Screenings to include cognitive, depression, and falls Referrals and appointments  In addition, I have reviewed and discussed with patient certain preventive protocols, quality metrics, and best practice recommendations. A written personalized care plan for preventive services as well as general preventive health recommendations were provided to patient.     Maxie BarbGetoria  Ayson Cherubini, CMA   10/14/2021   Nurse Notes: Non face to face 20 minutes.  Ms. Diana PilarBartlett , Thank you for taking time to come for your Medicare Wellness Visit. I appreciate your ongoing commitment to your health goals. Please review the following plan we discussed and let me know  if I can assist you in the future.   These are the goals we discussed:  Goals      Manage My Emotions     Patient Stated     Drink more water        This is a list of the screening recommended for you and due dates:  Health Maintenance  Topic Date Due   DEXA scan (bone density measurement)  Never done   Zoster (Shingles) Vaccine (1 of 2) 10/17/2021*   COVID-19 Vaccine (1) 10/30/2021*   Pneumonia Vaccine (2 - PCV) 07/18/2022*   Mammogram  07/18/2022*   Tetanus Vaccine  07/18/2022*   Flu Shot  12/12/2021   Cologuard (Stool DNA test)  08/05/2022   HPV Vaccine  Aged Out   Hepatitis C Screening: USPSTF Recommendation to screen - Ages 18-79 yo.  Discontinued  *Topic was postponed. The date shown is not the original due date.

## 2021-10-14 NOTE — Patient Instructions (Signed)
  Diana Pittman , Thank you for taking time to come for your Medicare Wellness Visit. I appreciate your ongoing commitment to your health goals. Please review the following plan we discussed and let me know if I can assist you in the future.   These are the goals we discussed:  Goals      Patient Stated     Drink more water        This is a list of the screening recommended for you and due dates:  Health Maintenance  Topic Date Due   COVID-19 Vaccine (1) Never done   Colon Cancer Screening  Never done   DEXA scan (bone density measurement)  Never done   Zoster (Shingles) Vaccine (1 of 2) 10/17/2021*   Pneumonia Vaccine (2 - PCV) 07/18/2022*   Mammogram  07/18/2022*   Tetanus Vaccine  07/18/2022*   Flu Shot  12/12/2021   HPV Vaccine  Aged Out   Hepatitis C Screening: USPSTF Recommendation to screen - Ages 18-79 yo.  Discontinued  *Topic was postponed. The date shown is not the original due date.

## 2021-10-18 ENCOUNTER — Ambulatory Visit: Payer: Medicare Other

## 2021-10-31 DIAGNOSIS — H2513 Age-related nuclear cataract, bilateral: Secondary | ICD-10-CM | POA: Diagnosis not present

## 2021-10-31 DIAGNOSIS — H353131 Nonexudative age-related macular degeneration, bilateral, early dry stage: Secondary | ICD-10-CM | POA: Diagnosis not present

## 2021-12-05 ENCOUNTER — Other Ambulatory Visit: Payer: Self-pay | Admitting: Family Medicine

## 2022-01-04 ENCOUNTER — Other Ambulatory Visit: Payer: Self-pay | Admitting: Family Medicine

## 2022-01-17 ENCOUNTER — Encounter: Payer: Medicare Other | Admitting: Family Medicine

## 2022-01-23 ENCOUNTER — Other Ambulatory Visit: Payer: Medicare Other

## 2022-01-23 ENCOUNTER — Ambulatory Visit: Payer: Medicare Other

## 2022-02-05 ENCOUNTER — Other Ambulatory Visit: Payer: Self-pay | Admitting: Family Medicine

## 2022-02-06 NOTE — Telephone Encounter (Signed)
Requesting: alprazolam Contract: 06/26/19 UDS: 06/26/19 Last Visit: 07/17/21 Next Visit: 02/08/22 Last Refill: 07/17/21(120,5)  Please Advise. Med pending

## 2022-02-08 ENCOUNTER — Ambulatory Visit (INDEPENDENT_AMBULATORY_CARE_PROVIDER_SITE_OTHER): Payer: Medicare Other | Admitting: Family Medicine

## 2022-02-08 ENCOUNTER — Encounter: Payer: Self-pay | Admitting: Family Medicine

## 2022-02-08 VITALS — BP 95/60 | HR 76 | Temp 98.7°F | Ht 62.0 in | Wt 141.2 lb

## 2022-02-08 DIAGNOSIS — F411 Generalized anxiety disorder: Secondary | ICD-10-CM

## 2022-02-08 DIAGNOSIS — F3342 Major depressive disorder, recurrent, in full remission: Secondary | ICD-10-CM

## 2022-02-08 DIAGNOSIS — Z23 Encounter for immunization: Secondary | ICD-10-CM | POA: Diagnosis not present

## 2022-02-08 DIAGNOSIS — Z0000 Encounter for general adult medical examination without abnormal findings: Secondary | ICD-10-CM

## 2022-02-08 DIAGNOSIS — E7800 Pure hypercholesterolemia, unspecified: Secondary | ICD-10-CM

## 2022-02-08 DIAGNOSIS — E2839 Other primary ovarian failure: Secondary | ICD-10-CM

## 2022-02-08 DIAGNOSIS — Z79899 Other long term (current) drug therapy: Secondary | ICD-10-CM | POA: Diagnosis not present

## 2022-02-08 LAB — CBC
HCT: 39.2 % (ref 36.0–46.0)
Hemoglobin: 13.3 g/dL (ref 12.0–15.0)
MCHC: 33.9 g/dL (ref 30.0–36.0)
MCV: 88.6 fl (ref 78.0–100.0)
Platelets: 223 10*3/uL (ref 150.0–400.0)
RBC: 4.43 Mil/uL (ref 3.87–5.11)
RDW: 13.4 % (ref 11.5–15.5)
WBC: 5.7 10*3/uL (ref 4.0–10.5)

## 2022-02-08 LAB — LIPID PANEL
Cholesterol: 210 mg/dL — ABNORMAL HIGH (ref 0–200)
HDL: 60.9 mg/dL (ref >39.00)
LDL Cholesterol: 122 mg/dL — ABNORMAL HIGH (ref 0–99)
NonHDL: 149.25
Total CHOL/HDL Ratio: 3
Triglycerides: 135 mg/dL (ref 0.0–149.0)
VLDL: 27 mg/dL (ref 0.0–40.0)

## 2022-02-08 LAB — COMPREHENSIVE METABOLIC PANEL
ALT: 6 U/L (ref 0–35)
AST: 14 U/L (ref 0–37)
Albumin: 4.1 g/dL (ref 3.5–5.2)
Alkaline Phosphatase: 79 U/L (ref 39–117)
BUN: 12 mg/dL (ref 6–23)
CO2: 28 mEq/L (ref 19–32)
Calcium: 9.3 mg/dL (ref 8.4–10.5)
Chloride: 105 mEq/L (ref 96–112)
Creatinine, Ser: 0.9 mg/dL (ref 0.40–1.20)
GFR: 65.78 mL/min (ref >60.00)
Glucose, Bld: 95 mg/dL (ref 70–99)
Potassium: 3.3 mEq/L — ABNORMAL LOW (ref 3.5–5.1)
Sodium: 139 mEq/L (ref 135–145)
Total Bilirubin: 0.4 mg/dL (ref 0.2–1.2)
Total Protein: 7.3 g/dL (ref 6.0–8.3)

## 2022-02-08 NOTE — Patient Instructions (Addendum)
You will be having the mammogram and bone density at [facility] Their address is [address] [place], [place] [address] Please contact [facility] at [phone] for further questions.  Health Maintenance, Female Adopting a healthy lifestyle and getting preventive care are important in promoting health and wellness. Ask your health care provider about: The right schedule for you to have regular tests and exams. Things you can do on your own to prevent diseases and keep yourself healthy. What should I know about diet, weight, and exercise? Eat a healthy diet  Eat a diet that includes plenty of vegetables, fruits, low-fat dairy products, and lean protein. Do not eat a lot of foods that are high in solid fats, added sugars, or sodium. Maintain a healthy weight Body mass index (BMI) is used to identify weight problems. It estimates body fat based on height and weight. Your health care provider can help determine your BMI and help you achieve or maintain a healthy weight. Get regular exercise Get regular exercise. This is one of the most important things you can do for your health. Most adults should: Exercise for at least 150 minutes each week. The exercise should increase your heart rate and make you sweat (moderate-intensity exercise). Do strengthening exercises at least twice a week. This is in addition to the moderate-intensity exercise. Spend less time sitting. Even light physical activity can be beneficial. Watch cholesterol and blood lipids Have your blood tested for lipids and cholesterol at 68 years of age, then have this test every 5 years. Have your cholesterol levels checked more often if: Your lipid or cholesterol levels are high. You are older than 68 years of age. You are at high risk for heart disease. What should I know about cancer screening? Depending on your health history and family history, you may need to have cancer screening at various ages.  This may include screening for: Breast cancer. Cervical cancer. Colorectal cancer. Skin cancer. Lung cancer. What should I know about heart disease, diabetes, and high blood pressure? Blood pressure and heart disease High blood pressure causes heart disease and increases the risk of stroke. This is more likely to develop in people who have high blood pressure readings or are overweight. Have your blood pressure checked: Every 3-5 years if you are 49-36 years of age. Every year if you are 53 years old or older. Diabetes Have regular diabetes screenings. This checks your fasting blood sugar level. Have the screening done: Once every three years after age 48 if you are at a normal weight and have a low risk for diabetes. More often and at a younger age if you are overweight or have a high risk for diabetes. What should I know about preventing infection? Hepatitis B If you have a higher risk for hepatitis B, you should be screened for this virus. Talk with your health care provider to find out if you are at risk for hepatitis B infection. Hepatitis C Testing is recommended for: Everyone born from [birth year] through 1967. Anyone with known risk factors for hepatitis C. Sexually transmitted infections (STIs) Get screened for STIs, including gonorrhea and chlamydia, if: You are sexually active and are younger than 68 years of age. You are older than 68 years of age and your health care provider tells you that you are at risk for this type of infection. Your sexual activity has changed since you were last screened, and you are at increased risk for chlamydia or gonorrhea. Ask your  health care provider if you are at risk. Ask your health care provider about whether you are at high risk for HIV. Your health care provider may recommend a prescription medicine to help prevent HIV infection. If you choose to take medicine to prevent HIV, you should first get tested for HIV. You should then be tested every 3  months for as long as you are taking the medicine. Pregnancy If you are about to stop having your period (premenopausal) and you may become pregnant, seek counseling before you get pregnant. Take 400 to 800 micrograms (mcg) of folic acid every day if you become pregnant. Ask for birth control (contraception) if you want to prevent pregnancy. Osteoporosis and menopause Osteoporosis is a disease in which the bones lose minerals and strength with aging. This can result in bone fractures. If you are 37 years old or older, or if you are at risk for osteoporosis and fractures, ask your health care provider if you should: Be screened for bone loss. Take a calcium or vitamin D supplement to lower your risk of fractures. Be given hormone replacement therapy (HRT) to treat symptoms of menopause. Follow these instructions at home: Alcohol use Do not drink alcohol if: Your health care provider tells you not to drink. You are pregnant, may be pregnant, or are planning to become pregnant. If you drink alcohol: Limit how much you have to: 0-1 drink a day. Know how much alcohol is in your drink. In the [place] one drink equals one 12 oz bottle of beer (355 mL), one 5 oz glass of wine (148 mL), or one 1 oz glass of hard liquor (44 mL). Lifestyle Do not use any products that contain nicotine or tobacco. These products include cigarettes, chewing tobacco, and vaping devices, such as e-cigarettes. If you need help quitting, ask your health care provider. Do not use street drugs. Do not share needles. Ask your health care provider for help if you need support or information about quitting drugs. General instructions Schedule regular health, dental, and eye exams. Stay current with your vaccines. Tell your health care provider if: You often feel depressed. You have ever been abused or do not feel safe at home. Summary Adopting a healthy lifestyle and getting preventive care are important in promoting health  and wellness. Follow your health care provider's instructions about healthy diet, exercising, and getting tested or screened for diseases. Follow your health care provider's instructions on monitoring your cholesterol and blood pressure. This information is not intended to replace advice given to you by your health care provider. Make sure you discuss any questions you have with your health care provider. Document Revised: 09/19/2020 Document Reviewed: 09/19/2020 Elsevier Patient Education  [place].

## 2022-02-08 NOTE — Progress Notes (Signed)
Office Note 02/08/2022  CC: CPE and RCI f/u  Patient is a 66 y.o. female who is here for annual health maintenance exam and 6 mo follow-up of chronic anxiety (high risk medication use). A/P as of last visit: "Epley maneuvers explained and handout given.   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  Chronic anxiety. She does not want counseling.  She knows she needs to get up and move around and get out with people.  She is going to try. Continue citalopram 40 mg a day and alprazolam 2 mg 4 times daily as needed, #120, refill x5"  INTERIM HX: Seira feels well. She feels like her mood and anxiety levels are back to baseline now.  She grieved for quite a while for her son's death.  She takes her alprazolam 1 mg 4 times daily and her citalopram 40 mg a day. PMP AWARE reviewed today: most recent rx for alprazolam was filled 01/04/22, # 120, rx by me. No red flags.   Past Medical History:  Diagnosis Date   Anxiety and depression    Herpes zoster 03/2017   R side of abdomen   Hypercholesterolemia 06/2020   TLC   Hypokalemia     History reviewed. No pertinent surgical history.  Family History  Problem Relation Age of Onset   Mental illness Mother    Arthritis Mother    Hypertension Mother    Stroke Mother    Diabetes Mother    Mental illness Father    Arthritis Father    Hypertension Father    Stroke Father    Diabetes Father     Social History   Socioeconomic History   Marital status: Married    Spouse name: Not on file   Number of children: Not on file   Years of education: Not on file   Highest education level: Not on file  Occupational History   Occupation: retired  Tobacco Use   Smoking status: Never   Smokeless tobacco: Never  Substance and Sexual Activity   Alcohol use: No   Drug use: No   Sexual activity: Not on file  Other Topics Concern   Not on file  Social History Narrative   Married, 3 sons.   Housewife.   10th grade  education.   No T/A/Ds.   Social Determinants of Health   Financial Resource Strain: Low Risk  (10/14/2021)   Overall Financial Resource Strain (CARDIA)    Difficulty of Paying Living Expenses: Not hard at all  Food Insecurity: No Food Insecurity (10/14/2021)   Hunger Vital Sign    Worried About Running Out of Food in the Last Year: Never true    Ran Out of Food in the Last Year: Never true  Transportation Needs: No Transportation Needs (10/14/2021)   PRAPARE - Administrator, Civil Service (Medical): No    Lack of Transportation (Non-Medical): No  Physical Activity: Inactive (10/14/2021)   Exercise Vital Sign    Days of Exercise per Week: 0 days    Minutes of Exercise per Session: 0 min  Stress: Stress Concern Present (10/14/2021)   [employer] of Occupational Health - Occupational Stress Questionnaire    Feeling of Stress : To some extent  Social Connections: Moderately Integrated (10/14/2021)   Social Connection and Isolation Panel [NHANES]    Frequency of Communication with Friends and Family: More than three times a week    Frequency of Social Gatherings with Friends and Family: Once a week  Attends Religious Services: More than 4 times per year    Active Member of Clubs or Organizations: No    Attends Banker Meetings: Never    Marital Status: Married  Catering manager Violence: Not At Risk (10/14/2021)   Humiliation, Afraid, Rape, and Kick questionnaire    Fear of Current or Ex-Partner: No    Emotionally Abused: No    Physically Abused: No    Sexually Abused: No    Outpatient Medications Prior to Visit  Medication Sig Dispense Refill   alprazolam (XANAX) 2 MG tablet TAKE ONE TABLET BY MOUTH FOUR TIMES DAILY AS NEEDED FOR ANXIETY 120 tablet 5   citalopram (CELEXA) 40 MG tablet TAKE ONE TABLET BY MOUTH DAILY 90 tablet 0   fexofenadine-pseudoephedrine (ALLEGRA-D) 60-120 MG 12 hr tablet 1 tab po qAM for allergy symptoms 90 tablet 1   fluticasone  (FLONASE) 50 MCG/ACT nasal spray INSTILL TWO SPRAYS IN EACH NOSTRIL EVERY DAY 16 g 2   HYDROcodone-acetaminophen (NORCO/VICODIN) 5-325 MG tablet Take 1-2 tablets by mouth every 6 (six) hours as needed for moderate pain. 30 tablet 0   multivitamin-lutein (OCUVITE-LUTEIN) CAPS capsule Take 1 capsule by mouth daily.     potassium chloride SA (KLOR-CON M) 20 MEQ tablet TAKE ONE TABLET BY MOUTH ONCE DAILY 90 tablet 1   No facility-administered medications prior to visit.    No Known Allergies  ROS Review of Systems  Constitutional:  Negative for appetite change, chills, fatigue and fever.  HENT:  Negative for congestion, dental problem, ear pain and sore throat.   Eyes:  Negative for discharge, redness and visual disturbance.  Respiratory:  Negative for cough, chest tightness, shortness of breath and wheezing.   Cardiovascular:  Negative for chest pain, palpitations and leg swelling.  Gastrointestinal:  Negative for abdominal pain, blood in stool, diarrhea, nausea and vomiting.  Genitourinary:  Negative for difficulty urinating, dysuria, flank pain, frequency, hematuria and urgency.  Musculoskeletal:  Negative for arthralgias, back pain, joint swelling, myalgias and neck stiffness.  Skin:  Negative for pallor and rash.  Neurological:  Negative for dizziness, speech difficulty, weakness and headaches.  Hematological:  Negative for adenopathy. Does not bruise/bleed easily.  Psychiatric/Behavioral:  Negative for confusion and sleep disturbance. The patient is not nervous/anxious.     PE;    02/08/2022    8:50 AM 07/17/2021    2:17 PM 01/26/2021   10:55 AM  Vitals with BMI  Height 5\' 2"  5\' 2"  5\' 2"   Weight 141 lbs 3 oz 137 lbs 137 lbs  BMI 25.82 25.05 25.05  Systolic 95 97 91  Diastolic 60 67 61  Pulse 76 87 72   Exam chaperoned by , CMA.   Gen: Alert, well appearing.  Patient is oriented to person, place, time, and situation. AFFECT: pleasant, lucid thought and  speech. ENT: Ears: EACs clear, normal epithelium.  TMs with good light reflex and landmarks bilaterally.  Eyes: no injection, icteris, swelling, or exudate.  EOMI, PERRLA. Nose: no drainage or turbinate edema/swelling.  No injection or focal lesion.  Mouth: lips without lesion/swelling.  Oral mucosa pink and moist.  Dentition intact and without obvious caries or gingival swelling.  Oropharynx without erythema, exudate, or swelling.  Neck: supple/nontender.  No LAD, mass, or TM.  Carotid pulses 2+ bilaterally, without bruits. CV: RRR, no m/r/g.   LUNGS: CTA bilat, nonlabored resps, good aeration in all lung fields. ABD: soft, NT, ND, BS normal.  No hepatospenomegaly or mass.  No bruits.  EXT: no clubbing, cyanosis, or edema.  Musculoskeletal: no joint swelling, erythema, warmth, or tenderness.  ROM of all joints intact. Skin - no sores or suspicious lesions or rashes or color changes  Pertinent labs:   Lab Results  Component Value Date   WBC 6.8 07/11/2020   HGB 14.2 07/11/2020   HCT 41.4 07/11/2020   MCV 85.0 07/11/2020   PLT 241 07/11/2020   Lab Results  Component Value Date   CREATININE 1.08 01/26/2021   BUN 11 01/26/2021   NA 138 01/26/2021   K 4.2 01/26/2021   CL 107 01/26/2021   CO2 24 01/26/2021   Lab Results  Component Value Date   ALT 11 07/11/2020   AST 19 07/11/2020   BILITOT 0.6 07/11/2020   Lab Results  Component Value Date   CHOL 233 (H) 07/11/2020   Lab Results  Component Value Date   HDL 65 07/11/2020   Lab Results  Component Value Date   LDLCALC 148 (H) 07/11/2020   Lab Results  Component Value Date   TRIG 92 07/11/2020   Lab Results  Component Value Date   CHOLHDL 3.6 07/11/2020   ASSESSMENT AND PLAN:   #1 GAD.  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 Continue citalopram 40 mg a day and alprazolam 1 mg 4 times daily long-term. Controlled substance contract updated today. Urine drug screen  today. Alprazolam 1 4 times daily, #120, refill x5 today.  #2 Health maintenance exam: Reviewed age and gender appropriate health maintenance issues (prudent diet, regular exercise, health risks of tobacco and excessive alcohol, use of seatbelts, fire alarms in home, use of sunscreen).  Also reviewed age and gender appropriate health screening as well as vaccine recommendations. Vaccines: Prevnar 20-->deferred until next visit.  Flu-->declined.  Shingrix->declined. Labs: cmet, flp, cbc. Cervical ca screening: pt has declined. Breast ca screening: has appt with mobile unit for next month. Colon ca screening: rpt cologuard 07/2022. DEXA: has appt with mobile unit next month.  An After Visit Summary was printed and given to the patient.  FOLLOW UP:  Return in about 6 months (around 08/09/2022) for routine chronic illness f/u.  Signed:  Crissie Sickles, MD           02/08/2022

## 2022-02-08 NOTE — Addendum Note (Signed)
Addended by: Beatrix Fetters on: 02/08/2022 09:57 AM   Modules accepted: Orders

## 2022-02-09 ENCOUNTER — Telehealth: Payer: Self-pay

## 2022-02-09 MED ORDER — POTASSIUM CHLORIDE CRYS ER 20 MEQ PO TBCR: 20.0000 meq | EXTENDED_RELEASE_TABLET | Freq: Two times a day (BID) | ORAL | 1 refills | Status: DC

## 2022-02-09 NOTE — Telephone Encounter (Signed)
-----   Message from Tammi Sou, MD sent at 02/08/2022  4:40 PM EDT ----- All labs normal except potassium slightly low still. I recommend she increase potassium (Klor-con 20 mEQ) to one tab twice per day, #180, RF x 1.

## 2022-02-12 LAB — DRUG MONITORING PANEL 376104, URINE

## 2022-02-12 LAB — DM TEMPLATE

## 2022-02-20 ENCOUNTER — Other Ambulatory Visit: Payer: Medicare Other

## 2022-02-20 ENCOUNTER — Ambulatory Visit: Payer: Medicare Other

## 2022-05-02 ENCOUNTER — Other Ambulatory Visit: Payer: Self-pay | Admitting: Family Medicine

## 2022-05-09 ENCOUNTER — Telehealth: Payer: Self-pay

## 2022-05-09 NOTE — Telephone Encounter (Signed)
Patient is wondering if she needs pap smear at her age?  She stated that Dr. Milinda Cave had spoke to her about it at one time. If she does need one, please refer her to female provider only. If not, no need to call her back.

## 2022-05-10 NOTE — Telephone Encounter (Signed)
LVM for pt to return call.  Note: pt declined screening during last appt. Please confirm if she has changed her mind and will confirm with provider for referral.

## 2022-05-11 NOTE — Telephone Encounter (Signed)
Please confirm if patient still needs pap

## 2022-05-11 NOTE — Telephone Encounter (Signed)
DEPENDS: Does she recall ever having a Pap smear in the past?  If so, when was the last one she had (estimate how many years ago).  Ever had an abnormal pap smear? Let me know.

## 2022-05-11 NOTE — Telephone Encounter (Signed)
LVM for pt to return call.  Note: if pt returns call, please see message below.

## 2022-05-15 NOTE — Telephone Encounter (Signed)
OK, no further pap smears are needed.

## 2022-05-15 NOTE — Telephone Encounter (Signed)
Pt had last pap 20 years ago and haven't had any abnormalities to her knowledge.

## 2022-05-15 NOTE — Telephone Encounter (Signed)
Pt informed

## 2022-05-24 ENCOUNTER — Ambulatory Visit: Payer: Medicare Other

## 2022-05-24 ENCOUNTER — Other Ambulatory Visit: Payer: Medicare Other

## 2022-05-25 ENCOUNTER — Other Ambulatory Visit: Payer: Self-pay | Admitting: Family Medicine

## 2022-07-14 ENCOUNTER — Other Ambulatory Visit: Payer: Self-pay | Admitting: Family Medicine

## 2022-07-16 NOTE — Telephone Encounter (Signed)
Requesting: alprazolam Contract: 02/07/22 UDS: 02/08/22 Last Visit: 02/08/22 Next Visit: 08/22/22 Last Refill: 02/07/22 (120,5)  Please Advise. Med pending

## 2022-07-26 ENCOUNTER — Other Ambulatory Visit: Payer: Medicare Other

## 2022-07-26 ENCOUNTER — Inpatient Hospital Stay: Admission: RE | Admit: 2022-07-26 | Payer: 59 | Source: Ambulatory Visit

## 2022-08-13 ENCOUNTER — Telehealth: Payer: Self-pay | Admitting: Family Medicine

## 2022-08-13 DIAGNOSIS — Z1211 Encounter for screening for malignant neoplasm of colon: Secondary | ICD-10-CM

## 2022-08-13 NOTE — Telephone Encounter (Signed)
Patient called and had some questions about the cologuard. She reports that she received the letter regarding this and wants to get it scheduled.

## 2022-08-13 NOTE — Telephone Encounter (Signed)
Pt was advised cologuard ordered, she should receive in the mail within the next 2 weeks.

## 2022-08-22 ENCOUNTER — Ambulatory Visit: Payer: Medicare Other | Admitting: Family Medicine

## 2022-08-27 DIAGNOSIS — Z1211 Encounter for screening for malignant neoplasm of colon: Secondary | ICD-10-CM | POA: Diagnosis not present

## 2022-09-05 ENCOUNTER — Encounter: Payer: Self-pay | Admitting: Family Medicine

## 2022-09-05 LAB — COLOGUARD: COLOGUARD: NEGATIVE

## 2022-09-07 ENCOUNTER — Other Ambulatory Visit: Payer: Self-pay

## 2022-09-07 MED ORDER — FLUTICASONE PROPIONATE 50 MCG/ACT NA SUSP: NASAL | 1 refills | Status: DC

## 2022-09-20 NOTE — Patient Instructions (Signed)
   It was very nice to see you today!   PLEASE NOTE:   If you had any lab tests please let us know if you have not heard back within a few days. You may see your results on MyChart before we have a chance to review them but we will give you a call once they are reviewed by [place]. If we ordered any referrals today, please let us know if you have not heard from their office within the next 2 weeks. You should receive a letter via MyChart confirming if the referral was approved and their office contact information to schedule.  Please try these tips to maintain a healthy lifestyle:  Eat most of your calories during the day when you are active. Eliminate processed foods including packaged sweets (pies, cakes, cookies), reduce intake of potatoes, white bread, white pasta, and white rice. Look for whole grain options, oat flour or almond flour.  Each meal should contain half fruits/vegetables, one quarter protein, and one quarter carbs (no bigger than a computer mouse).  Cut down on sweet beverages. This includes juice, soda, and sweet tea. Also watch fruit intake, though this is a healthier sweet option, it still contains natural sugar! Limit to 3 servings daily.  Drink at least 1 glass of water with each meal and aim for at least 8 glasses per day  Exercise at least 150 minutes every week.

## 2022-09-21 ENCOUNTER — Encounter: Payer: Self-pay | Admitting: Family Medicine

## 2022-09-21 ENCOUNTER — Ambulatory Visit (INDEPENDENT_AMBULATORY_CARE_PROVIDER_SITE_OTHER): Payer: 59 | Admitting: Family Medicine

## 2022-09-21 VITALS — BP 116/79 | HR 88 | Wt 144.8 lb

## 2022-09-21 DIAGNOSIS — Z8659 Personal history of other mental and behavioral disorders: Secondary | ICD-10-CM

## 2022-09-21 DIAGNOSIS — F411 Generalized anxiety disorder: Secondary | ICD-10-CM | POA: Diagnosis not present

## 2022-09-21 MED ORDER — FEXOFENADINE-PSEUDOEPHED ER 60-120 MG PO TB12: ORAL_TABLET | ORAL | 1 refills | Status: DC

## 2022-09-21 MED ORDER — POTASSIUM CHLORIDE CRYS ER 20 MEQ PO TBCR: 20.0000 meq | EXTENDED_RELEASE_TABLET | Freq: Two times a day (BID) | ORAL | 1 refills | Status: DC

## 2022-09-21 MED ORDER — CITALOPRAM HYDROBROMIDE 40 MG PO TABS: 40.0000 mg | ORAL_TABLET | Freq: Every day | ORAL | 1 refills | Status: DC

## 2022-09-21 NOTE — Progress Notes (Signed)
OFFICE VISIT  09/21/2022  CC:  Chief Complaint  Patient presents with   Follow-up    6 month follow up. No other questions or concerns.    Patient is a 67 y.o. female who presents for 47-month follow-up GAD  INTERIM HX: Diana Pittman is doing much better.  She has a new cat.  She seems much brighter, better humor, more energy, getting out and doing things again as per her normal.  Past Medical History:  Diagnosis Date   Anxiety and depression    Colon cancer screening    08/2022 cologuard neg   Herpes zoster 03/2017   R side of abdomen   Hypercholesterolemia 06/2020   TLC   Hypokalemia     History reviewed. No pertinent surgical history.  Outpatient Medications Prior to Visit  Medication Sig Dispense Refill   alprazolam (XANAX) 2 MG tablet TAKE ONE TABLET BY MOUTH FOUR TIMES DAILY AS NEEDED FOR ANXIETY 120 tablet 5   citalopram (CELEXA) 40 MG tablet TAKE ONE TABLET BY MOUTH DAILY 90 tablet 1   fexofenadine-pseudoephedrine (ALLEGRA-D) 60-120 MG 12 hr tablet 1 tab po qAM for allergy symptoms 90 tablet 1   fluticasone (FLONASE) 50 MCG/ACT nasal spray INSTILL TWO SPRAYS IN EACH NOSTRIL EVERY DAY 48 g 1   HYDROcodone-acetaminophen (NORCO/VICODIN) 5-325 MG tablet Take 1-2 tablets by mouth every 6 (six) hours as needed for moderate pain. 30 tablet 0   multivitamin-lutein (OCUVITE-LUTEIN) CAPS capsule Take 1 capsule by mouth daily.     potassium chloride SA (KLOR-CON M) 20 MEQ tablet Take 1 tablet (20 mEq total) by mouth 2 (two) times daily. 180 tablet 1   No facility-administered medications prior to visit.    No Known Allergies  Review of Systems As per HPI  PE:    09/21/2022    9:51 AM 02/08/2022    8:50 AM 07/17/2021    2:17 PM  Vitals with BMI  Height  5\' 2"  5\' 2"   Weight 144 lbs 13 oz 141 lbs 3 oz 137 lbs  BMI  25.82 25.05  Systolic 116 95 97  Diastolic 79 60 67  Pulse 88 76 87     Physical Exam  Gen: Alert, well appearing.  Patient is oriented to person, place, time,  and situation. AFFECT: pleasant, lucid thought and speech. CV: RRR, no m/r/g.   LUNGS: CTA bilat, nonlabored resps, good aeration in all lung fields. EXT: no clubbing or cyanosis.  no edema.    LABS:  Last CBC Lab Results  Component Value Date   WBC 5.7 02/08/2022   HGB 13.3 02/08/2022   HCT 39.2 02/08/2022   MCV 88.6 02/08/2022   MCH 29.2 07/11/2020   RDW 13.4 02/08/2022   PLT 223.0 02/08/2022   Last metabolic panel Lab Results  Component Value Date   GLUCOSE 95 02/08/2022   NA 139 02/08/2022   K 3.3 (L) 02/08/2022   CL 105 02/08/2022   CO2 28 02/08/2022   BUN 12 02/08/2022   CREATININE 0.90 02/08/2022   CALCIUM 9.3 02/08/2022   PROT 7.3 02/08/2022   ALBUMIN 4.1 02/08/2022   BILITOT 0.4 02/08/2022   ALKPHOS 79 02/08/2022   AST 14 02/08/2022   ALT 6 02/08/2022   Last lipids Lab Results  Component Value Date   CHOL 210 (H) 02/08/2022   HDL 60.90 02/08/2022   LDLCALC 122 (H) 02/08/2022   TRIG 135.0 02/08/2022   CHOLHDL 3 02/08/2022    IMPRESSION AND PLAN:  #1 GAD, history of depression.  She is doing very well Continue citalopram 40 mg a day and Xanax 2 mg 4 times daily.  An After Visit Summary was printed and given to the patient.  FOLLOW UP: Return in about 6 months (around 03/24/2023) for annual CPE (fasting).  Signed:  Santiago Bumpers, MD           09/21/2022

## 2022-10-01 ENCOUNTER — Telehealth: Payer: Self-pay | Admitting: Family Medicine

## 2022-10-01 NOTE — Telephone Encounter (Signed)
Pharmacy updated.

## 2022-10-01 NOTE — Telephone Encounter (Signed)
Diana Pittman called to notify us of her previous pharmacy closing in [place]. She will now have her prescriptions sent to [facility] in [place] moving forward.

## 2022-10-17 ENCOUNTER — Ambulatory Visit (INDEPENDENT_AMBULATORY_CARE_PROVIDER_SITE_OTHER): Payer: 59

## 2022-10-17 VITALS — Wt 144.0 lb

## 2022-10-17 DIAGNOSIS — E2839 Other primary ovarian failure: Secondary | ICD-10-CM | POA: Diagnosis not present

## 2022-10-17 DIAGNOSIS — Z1231 Encounter for screening mammogram for malignant neoplasm of breast: Secondary | ICD-10-CM | POA: Diagnosis not present

## 2022-10-17 DIAGNOSIS — Z0000 Encounter for general adult medical examination without abnormal findings: Secondary | ICD-10-CM | POA: Diagnosis not present

## 2022-10-17 NOTE — Patient Instructions (Signed)
Diana Pittman , Thank you for taking time to come for your Medicare Wellness Visit. I appreciate your ongoing commitment to your health goals. Please review the following plan we discussed and let me know if I can assist you in the future.   These are the goals we discussed:  Goals      Manage My Emotions     Patient Stated     Drink more water        This is a list of the screening recommended for you and due dates:  Health Maintenance  Topic Date Due   COVID-19 Vaccine (1) Never done   Zoster (Shingles) Vaccine (1 of 2) Never done   Mammogram  Never done   DEXA scan (bone density measurement)  Never done   DTaP/Tdap/Td vaccine (2 - Tdap) 05/14/2021   Flu Shot  12/13/2022   Medicare Annual Wellness Visit  10/17/2023   Cologuard (Stool DNA test)  08/26/2025   Pneumonia Vaccine  Completed   HPV Vaccine  Aged Out   Colon Cancer Screening  Discontinued   Hepatitis C Screening  Discontinued    Advanced directives: Advance directive discussed with you today. Even though you declined this today please call our office should you change your mind and we can give you the proper paperwork for you to fill out.   Conditions/risks identified: get bback to being motivated to be active   Next appointment: Follow up in one year for your annual wellness visit    Preventive Care 65 Years and Older, Female Preventive care refers to lifestyle choices and visits with your health care provider that can promote health and wellness. What does preventive care include? A yearly physical exam. This is also called an annual well check. Dental exams once or twice a year. Routine eye exams. Ask your health care provider how often you should have your eyes checked. Personal lifestyle choices, including: Daily care of your teeth and gums. Regular physical activity. Eating a healthy diet. Avoiding tobacco and drug use. Limiting alcohol use. Practicing safe sex. Taking low-dose aspirin every  day. Taking vitamin and mineral supplements as recommended by your health care provider. What happens during an annual well check? The services and screenings done by your health care provider during your annual well check will depend on your age, overall health, lifestyle risk factors, and family history of disease. Counseling  Your health care provider may ask you questions about your: Alcohol use. Tobacco use. Drug use. Emotional well-being. Home and relationship well-being. Sexual activity. Eating habits. History of falls. Memory and ability to understand (cognition). Work and work Astronomer. Reproductive health. Screening  You may have the following tests or measurements: Height, weight, and BMI. Blood pressure. Lipid and cholesterol levels. These may be checked every 5 years, or more frequently if you are over 48 years old. Skin check. Lung cancer screening. You may have this screening every year starting at age 96 if you have a 30-pack-year history of smoking and currently smoke or have quit within the past 15 years. Fecal occult blood test (FOBT) of the stool. You may have this test every year starting at age 31. Flexible sigmoidoscopy or colonoscopy. You may have a sigmoidoscopy every 5 years or a colonoscopy every 10 years starting at age 66. Hepatitis C blood test. Hepatitis B blood test. Sexually transmitted disease (STD) testing. Diabetes screening. This is done by checking your blood sugar (glucose) after you have not eaten for a while (fasting). You may  have this done every 1-3 years. Bone density scan. This is done to screen for osteoporosis. You may have this done starting at age 81. Mammogram. This may be done every 1-2 years. Talk to your health care provider about how often you should have regular mammograms. Talk with your health care provider about your test results, treatment options, and if necessary, the need for more tests. Vaccines  Your health care  provider may recommend certain vaccines, such as: Influenza vaccine. This is recommended every year. Tetanus, diphtheria, and acellular pertussis (Tdap, Td) vaccine. You may need a Td booster every 10 years. Zoster vaccine. You may need this after age 33. Pneumococcal 13-valent conjugate (PCV13) vaccine. One dose is recommended after age 56. Pneumococcal polysaccharide (PPSV23) vaccine. One dose is recommended after age 71. Talk to your health care provider about which screenings and vaccines you need and how often you need them. This information is not intended to replace advice given to you by your health care provider. Make sure you discuss any questions you have with your health care provider. Document Released: 05/27/2015 Document Revised: 01/18/2016 Document Reviewed: 03/01/2015 Elsevier Interactive Patient Education  2017 [employer].  Fall Prevention in the Home Falls can cause injuries. They can happen to people of all ages. There are many things you can do to make your home safe and to help prevent falls. What can I do on the outside of my home? Regularly fix the edges of walkways and driveways and fix any cracks. Remove anything that might make you trip as you walk through a door, such as a raised step or threshold. Trim any bushes or trees on the path to your home. Use bright outdoor lighting. Clear any walking paths of anything that might make someone trip, such as rocks or tools. Regularly check to see if handrails are loose or broken. Make sure that both sides of any steps have handrails. Any raised decks and porches should have guardrails on the edges. Have any leaves, snow, or ice cleared regularly. Use sand or salt on walking paths during winter. Clean up any spills in your garage right away. This includes oil or grease spills. What can I do in the bathroom? Use night lights. Install grab bars by the toilet and in the tub and shower. Do not use towel bars as grab  bars. Use non-skid mats or decals in the tub or shower. If you need to sit down in the shower, use a plastic, non-slip stool. Keep the floor dry. Clean up any water that spills on the floor as soon as it happens. Remove soap buildup in the tub or shower regularly. Attach bath mats securely with double-sided non-slip rug tape. Do not have throw rugs and other things on the floor that can make you trip. What can I do in the bedroom? Use night lights. Make sure that you have a light by your bed that is easy to reach. Do not use any sheets or blankets that are too big for your bed. They should not hang down onto the floor. Have a firm chair that has side arms. You can use this for support while you get dressed. Do not have throw rugs and other things on the floor that can make you trip. What can I do in the kitchen? Clean up any spills right away. Avoid walking on wet floors. Keep items that you use a lot in easy-to-reach places. If you need to reach something above you, use a strong step  stool that has a grab bar. Keep electrical cords out of the way. Do not use floor polish or wax that makes floors slippery. If you must use wax, use non-skid floor wax. Do not have throw rugs and other things on the floor that can make you trip. What can I do with my stairs? Do not leave any items on the stairs. Make sure that there are handrails on both sides of the stairs and use them. Fix handrails that are broken or loose. Make sure that handrails are as long as the stairways. Check any carpeting to make sure that it is firmly attached to the stairs. Fix any carpet that is loose or worn. Avoid having throw rugs at the top or bottom of the stairs. If you do have throw rugs, attach them to the floor with carpet tape. Make sure that you have a light switch at the top of the stairs and the bottom of the stairs. If you do not have them, ask someone to add them for you. What else can I do to help prevent  falls? Wear shoes that: Do not have high heels. Have rubber bottoms. Are comfortable and fit you well. Are closed at the toe. Do not wear sandals. If you use a stepladder: Make sure that it is fully opened. Do not climb a closed stepladder. Make sure that both sides of the stepladder are locked into place. Ask someone to hold it for you, if possible. Clearly mark and make sure that you can see: Any grab bars or handrails. First and last steps. Where the edge of each step is. Use tools that help you move around (mobility aids) if they are needed. These include: Canes. Walkers. Scooters. Crutches. Turn on the lights when you go into a dark area. Replace any light bulbs as soon as they burn out. Set up your furniture so you have a clear path. Avoid moving your furniture around. If any of your floors are uneven, fix them. If there are any pets around you, be aware of where they are. Review your medicines with your doctor. Some medicines can make you feel dizzy. This can increase your chance of falling. Ask your doctor what other things that you can do to help prevent falls. This information is not intended to replace advice given to you by your health care provider. Make sure you discuss any questions you have with your health care provider. Document Released: 02/24/2009 Document Revised: 10/06/2015 Document Reviewed: 06/04/2014 Elsevier Interactive Patient Education  2017 [employer].

## 2022-10-17 NOTE — Progress Notes (Signed)
I connected with  Diana Pittman on 10/17/22 by a audio enabled telemedicine application and verified that I am speaking with the correct person using two identifiers.  Patient Location: Home  Provider Location: Home Office  I discussed the limitations of evaluation and management by telemedicine. The patient expressed understanding and agreed to proceed.   Subjective:   Diana Pittman is a 67 y.o. female who presents for Medicare Annual (Subsequent) preventive examination.  Review of Systems     Cardiac Risk Factors include: advanced age (>9men, >93 women)     Objective:    Today's Vitals   10/17/22 1436  Weight: 144 lb (65.3 kg)   Body mass index is 26.34 kg/m.     10/17/2022    2:43 PM 10/14/2021    9:11 AM 10/12/2020   12:48 PM 04/01/2017    2:58 PM  Advanced Directives  Does Patient Have a Medical Advance Directive? No No No No  Does patient want to make changes to medical advance directive?   Yes (MAU/Ambulatory/Procedural Areas - Information given)   Would patient like information on creating a medical advance directive? No - Patient declined Yes (MAU/Ambulatory/Procedural Areas - Information given)      Current Medications (verified) Outpatient Encounter Medications as of 10/17/2022  Medication Sig   alprazolam (XANAX) 2 MG tablet TAKE ONE TABLET BY MOUTH FOUR TIMES DAILY AS NEEDED FOR ANXIETY   citalopram (CELEXA) 40 MG tablet Take 1 tablet (40 mg total) by mouth daily.   fexofenadine-pseudoephedrine (ALLEGRA-D) 60-120 MG 12 hr tablet 1 tab po qAM for allergy symptoms   fluticasone (FLONASE) 50 MCG/ACT nasal spray INSTILL TWO SPRAYS IN EACH NOSTRIL EVERY DAY   multivitamin-lutein (OCUVITE-LUTEIN) CAPS capsule Take 1 capsule by mouth daily.   potassium chloride SA (KLOR-CON M) 20 MEQ tablet Take 1 tablet (20 mEq total) by mouth 2 (two) times daily.   No facility-administered encounter medications on file as of 10/17/2022.    Allergies (verified) Patient has no known  allergies.   History: Past Medical History:  Diagnosis Date   Anxiety and depression    Colon cancer screening    08/2022 cologuard neg   Herpes zoster 03/2017   R side of abdomen   Hypercholesterolemia 06/2020   TLC   Hypokalemia    History reviewed. No pertinent surgical history. Family History  Problem Relation Age of Onset   Mental illness Mother    Arthritis Mother    Hypertension Mother    Stroke Mother    Diabetes Mother    Mental illness Father    Arthritis Father    Hypertension Father    Stroke Father    Diabetes Father    Social History   Socioeconomic History   Marital status: Married    Spouse name: Not on file   Number of children: Not on file   Years of education: Not on file   Highest education level: Not on file  Occupational History   Occupation: retired  Tobacco Use   Smoking status: Never   Smokeless tobacco: Never  Substance and Sexual Activity   Alcohol use: No   Drug use: No   Sexual activity: Not on file  Other Topics Concern   Not on file  Social History Narrative   Married, 3 sons.   Housewife.   10th grade education.   No T/A/Ds.   Social Determinants of Health   Financial Resource Strain: Low Risk  (10/17/2022)   Overall Financial Resource Strain (CARDIA)  Difficulty of Paying Living Expenses: Not hard at all  Food Insecurity: No Food Insecurity (10/17/2022)   Hunger Vital Sign    Worried About Running Out of Food in the Last Year: Never true    Ran Out of Food in the Last Year: Never true  Transportation Needs: No Transportation Needs (10/17/2022)   PRAPARE - Administrator, Civil Service (Medical): No    Lack of Transportation (Non-Medical): No  Physical Activity: Inactive (10/17/2022)   Exercise Vital Sign    Days of Exercise per Week: 0 days    Minutes of Exercise per Session: 0 min  Stress: No Stress Concern Present (10/17/2022)   [employer] of Occupational Health - Occupational Stress Questionnaire     Feeling of Stress : Not at all  Social Connections: Moderately Integrated (10/17/2022)   Social Connection and Isolation Panel [NHANES]    Frequency of Communication with Friends and Family: More than three times a week    Frequency of Social Gatherings with Friends and Family: More than three times a week    Attends Religious Services: More than 4 times per year    Active Member of [employer] or Organizations: No    Attends Engineer, structural: Never    Marital Status: Married    Tobacco Counseling Counseling given: Not Answered   Clinical Intake:  Pre-visit preparation completed: Yes  Pain : No/denies pain     BMI - recorded: 26.34 Nutritional Status: BMI 25 -29 Overweight Nutritional Risks: None Diabetes: No  How often do you need to have someone help you when you read instructions, pamphlets, or other written materials from your doctor or pharmacy?: 1 - Never  Diabetic?no  Interpreter Needed?: No  Information entered by :: Lanier Ensign, LPN   Activities of Daily Living    10/17/2022    2:46 PM  In your present state of health, do you have any difficulty performing the following activities:  Hearing? 0  Vision? 0  Difficulty concentrating or making decisions? 0  Walking or climbing stairs? 0  Dressing or bathing? 0  Doing errands, shopping? 0  Preparing Food and eating ? N  Using the Toilet? N  In the past six months, have you accidently leaked urine? N  Do you have problems with loss of bowel control? N  Managing your Medications? N  Managing your Finances? N  Housekeeping or managing your Housekeeping? N    Patient Care Team: Jeoffrey Massed, MD as PCP - General (Family Medicine)  Indicate any recent Medical Services you may have received from other than [facility] providers in the past year (date may be approximate).     Assessment:   This is a routine wellness examination for Diana Pittman.  Hearing/Vision screen Hearing Screening - Comments:: Pt  denies any hearing issues  Vision Screening - Comments:: [facility] at [facility]   Dietary issues and exercise activities discussed: Current Exercise Habits: The patient does not participate in regular exercise at present   Goals Addressed             This Visit's Progress    Patient Stated       Get motivation to be active        Depression Screen    10/17/2022    2:40 PM 09/21/2022   10:00 AM 07/17/2021    3:12 PM 01/26/2021   11:38 AM 10/12/2020   12:52 PM 07/11/2020   11:34 AM 01/05/2020   11:34  AM  PHQ 2/9 Scores  PHQ - 2 Score 1 2 3 4 1 3 4   PHQ- 9 Score 1 6 9 11  11 14     Fall Risk    10/17/2022    2:45 PM 09/21/2022   10:00 AM 10/14/2021    9:13 AM 07/17/2021    2:19 PM 10/12/2020   12:50 PM  Fall Risk   Falls in the past year? 0 0 0 0 0  Number falls in past yr: 0 1 0 0 0  Injury with Fall? 0 0 0 0 0  Risk for fall due to : Impaired vision No Fall Risks     Follow up Falls prevention discussed Falls evaluation completed Falls evaluation completed      FALL RISK PREVENTION PERTAINING TO THE HOME:  Any stairs in or around the home? Yes  If so, are there any without handrails? No  Home free of loose throw rugs in walkways, pet beds, electrical cords, etc? Yes  Adequate lighting in your home to reduce risk of falls? Yes   ASSISTIVE DEVICES UTILIZED TO PREVENT FALLS:  Life alert? No  Use of a cane, walker or w/c? No  Grab bars in the bathroom? Yes  Shower chair or bench in shower? No  Elevated toilet seat or a handicapped toilet? No   TIMED UP AND GO:  Was the test performed? No .   Cognitive Function:        10/17/2022    2:46 PM 10/14/2021    9:25 AM  6CIT Screen  What Year? 0 points 0 points  What month? 0 points 0 points  What time? 0 points 0 points  Count back from 20 0 points 0 points  Months in reverse 0 points 0 points  Repeat phrase 0 points 2 points  Total Score 0 points 2 points    Immunizations Immunization History   Administered Date(s) Administered   Fluad Quad(high Dose 65+) 04/11/2020   PNEUMOCOCCAL CONJUGATE-20 02/08/2022   Pneumococcal Polysaccharide-23 04/11/2020   Td 05/15/2011    TDAP status: Due, Education has been provided regarding the importance of this vaccine. Advised may receive this vaccine at local pharmacy or Health Dept. Aware to provide a copy of the vaccination record if obtained from local pharmacy or Health Dept. Verbalized acceptance and understanding.  Flu Vaccine status: Due, Education has been provided regarding the importance of this vaccine. Advised may receive this vaccine at local pharmacy or Health Dept. Aware to provide a copy of the vaccination record if obtained from local pharmacy or Health Dept. Verbalized acceptance and understanding.  Pneumococcal vaccine status: Up to date  Covid-19 vaccine status: Declined, Education has been provided regarding the importance of this vaccine but patient still declined. Advised may receive this vaccine at local pharmacy or Health Dept.or vaccine clinic. Aware to provide a copy of the vaccination record if obtained from local pharmacy or Health Dept. Verbalized acceptance and understanding.  Qualifies for Shingles Vaccine? Yes   Zostavax completed No   Shingrix Completed?: No.    Education has been provided regarding the importance of this vaccine. Patient has been advised to call insurance company to determine out of pocket expense if they have not yet received this vaccine. Advised may also receive vaccine at local pharmacy or Health Dept. Verbalized acceptance and understanding.  Screening Tests Health Maintenance  Topic Date Due   MAMMOGRAM  Never done   DEXA SCAN  Never done   DTaP/Tdap/Td (2 - Tdap)  05/14/2021   COVID-19 Vaccine (1) 11/02/2022 (Originally 10/15/1960)   Zoster Vaccines- Shingrix (1 of 2) 01/17/2023 (Originally 10/14/1974)   INFLUENZA VACCINE  12/13/2022   Medicare Annual Wellness (AWV)  10/17/2023   Fecal  DNA (Cologuard)  08/26/2025   Pneumonia Vaccine 61+ Years old  Completed   HPV VACCINES  Aged Out   Colonoscopy  Discontinued   Hepatitis C Screening  Discontinued    Health Maintenance  Health Maintenance Due  Topic Date Due   MAMMOGRAM  Never done   DEXA SCAN  Never done   DTaP/Tdap/Td (2 - Tdap) 05/14/2021    Colorectal cancer screening: Type of screening: Cologuard. Completed 08/27/22. Repeat every 3 years  Mammogram status: Ordered 10/17/22. Pt provided with contact info and advised to call to schedule appt.   Bone Density status: Ordered 10/17/22. Pt provided with contact info and advised to call to schedule appt.   Additional Screening:  Hepatitis C Screening: does not qualify  Vision Screening: Recommended annual ophthalmology exams for early detection of glaucoma and other disorders of the eye. Is the patient up to date with their annual eye exam?  Yes  Who is the provider or what is the name of the office in which the patient attends annual eye exams? [facility]  If pt is not established with a provider, would they like to be referred to a provider to establish care? No .   Dental Screening: Recommended annual dental exams for proper oral hygiene  Community Resource Referral / Chronic Care Management: CRR required this visit?  No   CCM required this visit?  No      Plan:     I have personally reviewed and noted the following in the patient's chart:   Medical and social history Use of alcohol, tobacco or illicit drugs  Current medications and supplements including opioid prescriptions. Patient is not currently taking opioid prescriptions. Functional ability and status Nutritional status Physical activity Advanced directives List of other physicians Hospitalizations, surgeries, and ER visits in previous 12 months Vitals Screenings to include cognitive, depression, and falls Referrals and appointments  In addition, I have reviewed and discussed with  patient certain preventive protocols, quality metrics, and best practice recommendations. A written personalized care plan for preventive services as well as general preventive health recommendations were provided to patient.     Marzella Schlein, LPN   05/19/1094   Nurse Notes: none

## 2023-02-07 ENCOUNTER — Telehealth: Payer: Self-pay

## 2023-02-07 MED ORDER — ALPRAZOLAM 2 MG PO TABS: ORAL_TABLET | ORAL | 5 refills | Status: DC

## 2023-02-07 NOTE — Telephone Encounter (Signed)
Refill requested for xanax.  Next OV 04/05/23

## 2023-02-07 NOTE — Telephone Encounter (Signed)
Prescription Request  02/07/2023  LOV: Visit date not found  What is the name of the medication or equipment? alprazolam Prudy Feeler) 2 MG tablet   Have you contacted your pharmacy to request a refill? No   Which pharmacy would you like this sent to?  [facility] [phone] - [place], [place] - [address] AT [facility] [address] [place] [address] Phone: [phone] Fax: [phone]    Patient notified that their request is being sent to the clinical staff for review and that they should receive a response within 2 business days.   Please advise at Mobile [phone] (mobile)

## 2023-02-08 ENCOUNTER — Other Ambulatory Visit: Payer: Self-pay

## 2023-02-08 NOTE — Telephone Encounter (Signed)
Requesting:  alprazolam Prudy Feeler) 2 MG tablet  Contract: 02/07/22 UDS: 02/08/22 Last Visit: 09/21/22 Next Visit: 04/05/23 Last Refill: 02/07/23 (120,5)   Duplicate medication request

## 2023-03-29 ENCOUNTER — Encounter: Payer: 59 | Admitting: Family Medicine

## 2023-04-05 ENCOUNTER — Encounter: Payer: 59 | Admitting: Family Medicine

## 2023-04-09 ENCOUNTER — Ambulatory Visit: Payer: 59 | Admitting: Family Medicine

## 2023-04-09 ENCOUNTER — Encounter: Payer: Self-pay | Admitting: Family Medicine

## 2023-04-09 VITALS — BP 110/75 | HR 71 | Ht 62.0 in | Wt 141.8 lb

## 2023-04-09 DIAGNOSIS — Z0000 Encounter for general adult medical examination without abnormal findings: Secondary | ICD-10-CM

## 2023-04-09 DIAGNOSIS — F411 Generalized anxiety disorder: Secondary | ICD-10-CM

## 2023-04-09 DIAGNOSIS — Z79899 Other long term (current) drug therapy: Secondary | ICD-10-CM | POA: Diagnosis not present

## 2023-04-09 LAB — COMPREHENSIVE METABOLIC PANEL
ALT: 8 U/L (ref 0–35)
AST: 19 U/L (ref 0–37)
Albumin: 4.2 g/dL (ref 3.5–5.2)
Alkaline Phosphatase: 65 U/L (ref 39–117)
BUN: 7 mg/dL (ref 6–23)
CO2: 30 meq/L (ref 19–32)
Calcium: 9.4 mg/dL (ref 8.4–10.5)
Chloride: 105 meq/L (ref 96–112)
Creatinine, Ser: 0.77 mg/dL (ref 0.40–1.20)
GFR: 78.67 mL/min (ref >60.00)
Glucose, Bld: 103 mg/dL — ABNORMAL HIGH (ref 70–99)
Potassium: 3.4 meq/L — ABNORMAL LOW (ref 3.5–5.1)
Sodium: 143 meq/L (ref 135–145)
Total Bilirubin: 0.6 mg/dL (ref 0.2–1.2)
Total Protein: 7.2 g/dL (ref 6.0–8.3)

## 2023-04-09 LAB — CBC
HCT: 41.2 % (ref 36.0–46.0)
Hemoglobin: 13.6 g/dL (ref 12.0–15.0)
MCHC: 32.9 g/dL (ref 30.0–36.0)
MCV: 91.9 fL (ref 78.0–100.0)
Platelets: 219 10*3/uL (ref 150.0–400.0)
RBC: 4.48 Mil/uL (ref 3.87–5.11)
RDW: 13.6 % (ref 11.5–15.5)
WBC: 4.8 10*3/uL (ref 4.0–10.5)

## 2023-04-09 LAB — LIPID PANEL
Cholesterol: 235 mg/dL — ABNORMAL HIGH (ref 0–200)
HDL: 62 mg/dL (ref >39.00)
LDL Cholesterol: 152 mg/dL — ABNORMAL HIGH (ref 0–99)
NonHDL: 172.93
Total CHOL/HDL Ratio: 4
Triglycerides: 103 mg/dL (ref 0.0–149.0)
VLDL: 20.6 mg/dL (ref 0.0–40.0)

## 2023-04-09 MED ORDER — CITALOPRAM HYDROBROMIDE 40 MG PO TABS: 40.0000 mg | ORAL_TABLET | Freq: Every day | ORAL | 1 refills | Status: DC

## 2023-04-09 MED ORDER — POTASSIUM CHLORIDE CRYS ER 20 MEQ PO TBCR: 20.0000 meq | EXTENDED_RELEASE_TABLET | Freq: Two times a day (BID) | ORAL | 1 refills | Status: DC

## 2023-04-09 MED ORDER — FEXOFENADINE-PSEUDOEPHED ER 60-120 MG PO TB12: ORAL_TABLET | ORAL | 1 refills | Status: DC

## 2023-04-09 NOTE — Progress Notes (Signed)
Office Note 04/09/2023  CC:  Chief Complaint  Patient presents with   Annual Exam    Pt is fasting.    Patient is a 67 y.o. female who is here for annual health maintenance exam and 70-month follow-up GAD. A/P as of last visit: "GAD, history of depression. She is doing very well Continue citalopram 40 mg a day and Xanax 2 mg 4 times daily."  INTERIM HX: Arnelle is doing fine.  She still grieves for her son who died 3 years ago. She feels like she sleeps well. No acute concerns.   PMP AWARE reviewed today: most recent rx for alprazolam 2 mg was filled 03/09/2023, # 120, rx by me. No red flags.  Past Medical History:  Diagnosis Date   Anxiety and depression    Colon cancer screening    08/2022 cologuard neg   Herpes zoster 03/2017   R side of abdomen   Hypercholesterolemia 06/2020   TLC   Hypokalemia     History reviewed. No pertinent surgical history.  Family History  Problem Relation Age of Onset   Mental illness Mother    Arthritis Mother    Hypertension Mother    Stroke Mother    Diabetes Mother    Mental illness Father    Arthritis Father    Hypertension Father    Stroke Father    Diabetes Father     Social History   Socioeconomic History   Marital status: Married    Spouse name: Not on file   Number of children: Not on file   Years of education: Not on file   Highest education level: Not on file  Occupational History   Occupation: retired  Tobacco Use   Smoking status: Never   Smokeless tobacco: Never  Substance and Sexual Activity   Alcohol use: No   Drug use: No   Sexual activity: Not on file  Other Topics Concern   Not on file  Social History Narrative   Married, 3 sons.   Housewife.   10th grade education.   No T/A/Ds.   Social Determinants of Health   Financial Resource Strain: Low Risk  (10/17/2022)   Overall Financial Resource Strain (CARDIA)    Difficulty of Paying Living Expenses: Not hard at all  Food Insecurity: No Food  Insecurity (10/17/2022)   Hunger Vital Sign    Worried About Running Out of Food in the Last Year: Never true    Ran Out of Food in the Last Year: Never true  Transportation Needs: No Transportation Needs (10/17/2022)   PRAPARE - Administrator, Civil Service (Medical): No    Lack of Transportation (Non-Medical): No  Physical Activity: Inactive (10/17/2022)   Exercise Vital Sign    Days of Exercise per Week: 0 days    Minutes of Exercise per Session: 0 min  Stress: No Stress Concern Present (10/17/2022)   [employer] of Occupational Health - Occupational Stress Questionnaire    Feeling of Stress : Not at all  Social Connections: Moderately Integrated (10/17/2022)   Social Connection and Isolation Panel [NHANES]    Frequency of Communication with Friends and Family: More than three times a week    Frequency of Social Gatherings with Friends and Family: More than three times a week    Attends Religious Services: More than 4 times per year    Active Member of [employer] or Organizations: No    Attends Banker Meetings: Never    Marital Status:  Married  Intimate Partner Violence: Not At Risk (10/17/2022)   Humiliation, Afraid, Rape, and Kick questionnaire    Fear of Current or Ex-Partner: No    Emotionally Abused: No    Physically Abused: No    Sexually Abused: No    Outpatient Medications Prior to Visit  Medication Sig Dispense Refill   alprazolam (XANAX) 2 MG tablet TAKE ONE TABLET BY MOUTH FOUR TIMES DAILY AS NEEDED FOR ANXIETY 120 tablet 5   fluticasone (FLONASE) 50 MCG/ACT nasal spray INSTILL TWO SPRAYS IN EACH NOSTRIL EVERY DAY 48 g 1   multivitamin-lutein (OCUVITE-LUTEIN) CAPS capsule Take 1 capsule by mouth daily.     citalopram (CELEXA) 40 MG tablet Take 1 tablet (40 mg total) by mouth daily. 90 tablet 1   fexofenadine-pseudoephedrine (ALLEGRA-D) 60-120 MG 12 hr tablet 1 tab po qAM for allergy symptoms 90 tablet 1   potassium chloride SA (KLOR-CON M) 20 MEQ  tablet Take 1 tablet (20 mEq total) by mouth 2 (two) times daily. 180 tablet 1   No facility-administered medications prior to visit.    No Known Allergies  Review of Systems  Constitutional:  Negative for appetite change, chills, fatigue and fever.  HENT:  Negative for congestion, dental problem, ear pain and sore throat.   Eyes:  Negative for discharge, redness and visual disturbance.  Respiratory:  Negative for cough, chest tightness, shortness of breath and wheezing.   Cardiovascular:  Negative for chest pain, palpitations and leg swelling.  Gastrointestinal:  Negative for abdominal pain, blood in stool, diarrhea, nausea and vomiting.  Genitourinary:  Negative for difficulty urinating, dysuria, flank pain, frequency, hematuria and urgency.  Musculoskeletal:  Negative for arthralgias, back pain, joint swelling, myalgias and neck stiffness.  Skin:  Negative for pallor and rash.  Neurological:  Negative for dizziness, speech difficulty, weakness and headaches.  Hematological:  Negative for adenopathy. Does not bruise/bleed easily.  Psychiatric/Behavioral:  Negative for confusion and sleep disturbance. The patient is not nervous/anxious.     PE;    04/09/2023   10:11 AM 10/17/2022    2:36 PM 09/21/2022    9:51 AM  Vitals with BMI  Height 5\' 2"     Weight 141 lbs 13 oz 144 lbs 144 lbs 13 oz  BMI 25.93    Systolic 110  116  Diastolic 75  79  Pulse 71  88   Gen: Alert, well appearing.  Patient is oriented to person, place, time, and situation. AFFECT: pleasant, lucid thought and speech. ENT: Ears: EACs clear, normal epithelium.  TMs with good light reflex and landmarks bilaterally.  Eyes: no injection, icteris, swelling, or exudate.  EOMI, PERRLA. Nose: no drainage or turbinate edema/swelling.  No injection or focal lesion.  Mouth: lips without lesion/swelling.  Oral mucosa pink and moist.  Dentition intact and without obvious caries or gingival swelling.  Oropharynx without erythema,  exudate, or swelling.  Neck: supple/nontender.  No LAD, mass, or TM.  Carotid pulses 2+ bilaterally, without bruits. CV: RRR, no m/r/g.   LUNGS: CTA bilat, nonlabored resps, good aeration in all lung fields. ABD: soft, NT, ND, BS normal.  No hepatospenomegaly or mass.  No bruits. EXT: no clubbing, cyanosis, or edema.  Musculoskeletal: no joint swelling, erythema, warmth, or tenderness.  ROM of all joints intact. Skin - no sores or suspicious lesions or rashes or color changes  Pertinent labs:   Lab Results  Component Value Date   WBC 5.7 02/08/2022   HGB 13.3 02/08/2022   HCT 39.2  02/08/2022   MCV 88.6 02/08/2022   PLT 223.0 02/08/2022   Lab Results  Component Value Date   CREATININE 0.90 02/08/2022   BUN 12 02/08/2022   NA 139 02/08/2022   K 3.3 (L) 02/08/2022   CL 105 02/08/2022   CO2 28 02/08/2022   Lab Results  Component Value Date   ALT 6 02/08/2022   AST 14 02/08/2022   ALKPHOS 79 02/08/2022   BILITOT 0.4 02/08/2022   Lab Results  Component Value Date   CHOL 210 (H) 02/08/2022   Lab Results  Component Value Date   HDL 60.90 02/08/2022   Lab Results  Component Value Date   LDLCALC 122 (H) 02/08/2022   Lab Results  Component Value Date   TRIG 135.0 02/08/2022   Lab Results  Component Value Date   CHOLHDL 3 02/08/2022   ASSESSMENT AND PLAN:   #1 health maintenance exam: Reviewed age and gender appropriate health maintenance issues (prudent diet, regular exercise, health risks of tobacco and excessive alcohol, use of seatbelts, fire alarms in home, use of sunscreen).  Also reviewed age and gender appropriate health screening as well as vaccine recommendations. Vaccines:  Tdap--> declined. Flu-->declined.  Shingrix->declined. Labs: cmet, flp, cbc. Cervical ca screening: pt has declined. Breast ca screening: ?04/2022 (mobile)--> she declined Colon ca screening: Cologuard NEG April 2024.  Plan repeat April 2027.Marland Kitchen DEXA: ? 04/2022 mobile--> she  declined  #2 GAD, history of depression. Stable on citalopram 40 mg a day and alprazolam 2 mg 4 times daily. A new prescription was not needed today.  An After Visit Summary was printed and given to the patient.  FOLLOW UP:  Return in about 6 months (around 10/07/2023) for routine chronic illness f/u.  Signed:  Santiago Bumpers, MD           04/09/2023

## 2023-04-15 ENCOUNTER — Encounter: Payer: Self-pay | Admitting: Family Medicine

## 2023-05-24 ENCOUNTER — Other Ambulatory Visit: Payer: Self-pay | Admitting: Family Medicine

## 2023-05-24 MED ORDER — FLUTICASONE PROPIONATE 50 MCG/ACT NA SUSP: NASAL | 1 refills | Status: DC

## 2023-05-27 ENCOUNTER — Other Ambulatory Visit: Payer: Self-pay | Admitting: Family Medicine

## 2023-05-27 NOTE — Telephone Encounter (Signed)
 Copied from CRM [id number]. Topic: Clinical - Medication Refill >> May 27, 2023 10:54 AM Corin V wrote: Most Recent Primary Care Visit:  Provider: CANDISE ALEENE DEL  Department: [facility]  Visit Type: PHYSICAL  Date: 04/09/2023  Medication: ***  Has the patient contacted their pharmacy?  (Agent: If no, request that the patient contact the pharmacy for the refill. If patient does not wish to contact the pharmacy document the reason why and proceed with request.) (Agent: If yes, when and what did the pharmacy advise?)  Is this the correct pharmacy for this prescription?  If no, delete pharmacy and type the correct one.  This is the patient's preferred pharmacy:  [facility] [phone] - [place], [place] - [address] AT [facility] [address] [place] [address] Phone: [phone] Fax: [phone]   Has the prescription been filled recently?   Is the patient out of the medication?   Has the patient been seen for an appointment in the last year OR does the patient have an upcoming appointment?   Can we respond through MyChart?   Agent: Please be advised that Rx refills may take up to 3 business days. We ask that you follow-up with your pharmacy.

## 2023-08-19 ENCOUNTER — Other Ambulatory Visit: Payer: Self-pay | Admitting: Family Medicine

## 2023-08-19 NOTE — Telephone Encounter (Signed)
 Requesting: alprazolam Contract: 02/07/22 UDS: 02/08/22 Last Visit: 04/09/23 Next Visit: 10/09/23 Last Refill: 02/07/23 (120,5)  Please Advise. Med pending

## 2023-08-22 DIAGNOSIS — H52223 Regular astigmatism, bilateral: Secondary | ICD-10-CM | POA: Diagnosis not present

## 2023-08-22 DIAGNOSIS — H35363 Drusen (degenerative) of macula, bilateral: Secondary | ICD-10-CM | POA: Diagnosis not present

## 2023-08-22 DIAGNOSIS — H524 Presbyopia: Secondary | ICD-10-CM | POA: Diagnosis not present

## 2023-10-09 ENCOUNTER — Ambulatory Visit: Payer: Self-pay | Admitting: Family Medicine

## 2023-10-09 ENCOUNTER — Ambulatory Visit (INDEPENDENT_AMBULATORY_CARE_PROVIDER_SITE_OTHER): Payer: 59 | Admitting: Family Medicine

## 2023-10-09 ENCOUNTER — Encounter: Payer: Self-pay | Admitting: Family Medicine

## 2023-10-09 VITALS — BP 119/75 | HR 79 | Temp 98.1°F | Ht 62.0 in | Wt 143.8 lb

## 2023-10-09 DIAGNOSIS — E876 Hypokalemia: Secondary | ICD-10-CM

## 2023-10-09 DIAGNOSIS — F339 Major depressive disorder, recurrent, unspecified: Secondary | ICD-10-CM

## 2023-10-09 DIAGNOSIS — F411 Generalized anxiety disorder: Secondary | ICD-10-CM | POA: Diagnosis not present

## 2023-10-09 DIAGNOSIS — Z79899 Other long term (current) drug therapy: Secondary | ICD-10-CM | POA: Diagnosis not present

## 2023-10-09 DIAGNOSIS — E7800 Pure hypercholesterolemia, unspecified: Secondary | ICD-10-CM | POA: Diagnosis not present

## 2023-10-09 LAB — BASIC METABOLIC PANEL WITH GFR
BUN: 10 mg/dL (ref 6–23)
CO2: 27 meq/L (ref 19–32)
Calcium: 9 mg/dL (ref 8.4–10.5)
Chloride: 105 meq/L (ref 96–112)
Creatinine, Ser: 0.71 mg/dL (ref 0.40–1.20)
GFR: 86.41 mL/min (ref >60.00)
Glucose, Bld: 115 mg/dL — ABNORMAL HIGH (ref 70–99)
Potassium: 3.1 meq/L — ABNORMAL LOW (ref 3.5–5.1)
Sodium: 141 meq/L (ref 135–145)

## 2023-10-09 MED ORDER — FEXOFENADINE-PSEUDOEPHED ER 60-120 MG PO TB12: ORAL_TABLET | ORAL | 1 refills | Status: DC

## 2023-10-09 MED ORDER — CITALOPRAM HYDROBROMIDE 40 MG PO TABS: 40.0000 mg | ORAL_TABLET | Freq: Every day | ORAL | 1 refills | Status: DC

## 2023-10-09 MED ORDER — POTASSIUM CHLORIDE CRYS ER 20 MEQ PO TBCR: 20.0000 meq | EXTENDED_RELEASE_TABLET | Freq: Two times a day (BID) | ORAL | 1 refills | Status: DC

## 2023-10-09 MED ORDER — FLUTICASONE PROPIONATE 50 MCG/ACT NA SUSP: NASAL | 1 refills | Status: DC

## 2023-10-09 NOTE — Patient Instructions (Signed)

## 2023-10-09 NOTE — Progress Notes (Signed)
 OFFICE VISIT  10/09/2023  CC:  Chief Complaint  Patient presents with   Medical Management of Chronic Issues    Patient is a 68 y.o. female who presents for 64-month follow-up GAD and history of depression. A/P as of last visit: " GAD, history of depression. Stable on citalopram  40 mg a day and alprazolam  2 mg 4 times daily. A new prescription was not needed today."  INTERIM HX:  Diana Pittman feels well. Her mood is stable, anxiety level stable. No problems with medications.  She does not get out and do much activity, admits that she sleeps a lot. She is still active in her church frequently.   PMP AWARE reviewed today: most recent rx for alprazolam  2 mg was filled 07/30/2023  # 120, rx by me. No red flags.  Past Medical History:  Diagnosis Date   Anxiety and depression    Colon cancer screening    08/2022 cologuard neg   Herpes zoster 03/2017   R side of abdomen   Hypercholesterolemia 06/2020   TLC.  fmhm 2024 7%   Hypokalemia     History reviewed. No pertinent surgical history.  Outpatient Medications Prior to Visit  Medication Sig Dispense Refill   alprazolam  (XANAX ) 2 MG tablet TAKE 1 TABLET BY MOUTH FOUR TIMES DAILY AS NEEDED FOR ANXIETY 120 tablet 5   multivitamin-lutein (OCUVITE-LUTEIN) CAPS capsule Take 1 capsule by mouth daily.     citalopram  (CELEXA ) 40 MG tablet Take 1 tablet (40 mg total) by mouth daily. 90 tablet 1   fexofenadine -pseudoephedrine (ALLEGRA-D) 60-120 MG 12 hr tablet 1 tab po qAM for allergy symptoms 90 tablet 1   fluticasone  (FLONASE ) 50 MCG/ACT nasal spray INSTILL TWO SPRAYS IN EACH NOSTRIL EVERY DAY 48 g 1   potassium chloride  SA (KLOR-CON  M) 20 MEQ tablet Take 1 tablet (20 mEq total) by mouth 2 (two) times daily. 180 tablet 1   No facility-administered medications prior to visit.    No Known Allergies  Review of Systems As per HPI  PE:    10/09/2023    9:43 AM 04/09/2023   10:11 AM 10/17/2022    2:36 PM  Vitals with BMI  Height 5\' 2"   5\' 2"    Weight 143 lbs 13 oz 141 lbs 13 oz 144 lbs  BMI 26.29 25.93   Systolic 119 110   Diastolic 75 75   Pulse 79 71      Physical Exam  Gen: Alert, well appearing.  Patient is oriented to person, place, time, and situation. AFFECT: pleasant, lucid thought and speech. No further exam today  LABS:  Last CBC Lab Results  Component Value Date   WBC 4.8 04/09/2023   HGB 13.6 04/09/2023   HCT 41.2 04/09/2023   MCV 91.9 04/09/2023   MCH 29.2 07/11/2020   RDW 13.6 04/09/2023   PLT 219.0 04/09/2023   Last metabolic panel Lab Results  Component Value Date   GLUCOSE 103 (H) 04/09/2023   NA 143 04/09/2023   K 3.4 (L) 04/09/2023   CL 105 04/09/2023   CO2 30 04/09/2023   BUN 7 04/09/2023   CREATININE 0.77 04/09/2023   GFR 78.67 04/09/2023   CALCIUM 9.4 04/09/2023   PROT 7.2 04/09/2023   ALBUMIN 4.2 04/09/2023   BILITOT 0.6 04/09/2023   ALKPHOS 65 04/09/2023   AST 19 04/09/2023   ALT 8 04/09/2023   Last lipids Lab Results  Component Value Date   CHOL 235 (H) 04/09/2023   HDL 62.00 04/09/2023  LDLCALC 152 (H) 04/09/2023   TRIG 103.0 04/09/2023   CHOLHDL 4 04/09/2023   IMPRESSION AND PLAN:  #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 In remission. Continue citalopram  40 mg a day and alprazolam  2 mg 4 times daily. Controlled substance contract updated. Urine drug screen today.  2.  Hypercholesterolemia.  LDL was 152 about 6 months ago.  Goal less than 100. Fortunately, her HDL is good--> 62. Discussed appropriate dietary modification and activity modification today.  We will repeat lipids in 6 months. No medication at this time.  #3 history of hypokalemia. She is on 20 mill equivalents of K-Dur twice daily. Monitor renal function and potassium today.  An After Visit Summary was printed and given to the patient.  FOLLOW UP: Return in about 6 months (around 04/10/2024) for annual CPE (fasting. Next CPE November/December 2025 Signed:  Arletha Lady, MD            10/09/2023

## 2023-10-10 MED ORDER — POTASSIUM CHLORIDE CRYS ER 20 MEQ PO TBCR: 40.0000 meq | EXTENDED_RELEASE_TABLET | Freq: Two times a day (BID) | ORAL | 1 refills | Status: DC

## 2023-10-10 NOTE — Addendum Note (Signed)
 Addended by: Terris Fickle D on: 10/10/2023 11:05 AM   Modules accepted: Orders

## 2023-10-11 LAB — DM TEMPLATE

## 2023-10-12 LAB — DRUG MONITORING PANEL 376104, URINE
Alphahydroxyalprazolam: 1456 ng/mL — ABNORMAL HIGH (ref <25)
Alphahydroxymidazolam: NEGATIVE ng/mL (ref <50)
Alphahydroxytriazolam: NEGATIVE ng/mL (ref <50)
Aminoclonazepam: NEGATIVE ng/mL (ref <25)
Amphetamines: NEGATIVE ng/mL (ref <500)
Barbiturates: NEGATIVE ng/mL (ref <300)
Benzodiazepines: POSITIVE ng/mL — AB (ref <100)
Cocaine Metabolite: NEGATIVE ng/mL (ref <150)
Desmethyltramadol: NEGATIVE ng/mL (ref <100)
Hydroxyethylflurazepam: NEGATIVE ng/mL (ref <50)
Lorazepam: NEGATIVE ng/mL (ref <50)
Nordiazepam: NEGATIVE ng/mL (ref <50)
Noroxycodone: 297 ng/mL — ABNORMAL HIGH (ref <50)
Opiates: NEGATIVE ng/mL (ref <100)
Oxazepam: NEGATIVE ng/mL (ref <50)
Oxycodone: NEGATIVE ng/mL (ref <50)
Oxycodone: POSITIVE ng/mL — AB (ref <100)
Oxymorphone: 206 ng/mL — ABNORMAL HIGH (ref <50)
Temazepam: NEGATIVE ng/mL (ref <50)
Tramadol: NEGATIVE ng/mL (ref <100)

## 2023-10-12 LAB — DM TEMPLATE

## 2023-10-17 ENCOUNTER — Other Ambulatory Visit

## 2023-10-17 ENCOUNTER — Other Ambulatory Visit (INDEPENDENT_AMBULATORY_CARE_PROVIDER_SITE_OTHER)

## 2023-10-17 DIAGNOSIS — E876 Hypokalemia: Secondary | ICD-10-CM

## 2023-10-17 NOTE — Addendum Note (Signed)
 Addended by: Terris Fickle D on: 10/17/2023 08:10 AM   Modules accepted: Orders

## 2023-10-18 ENCOUNTER — Encounter: Payer: Self-pay | Admitting: Family Medicine

## 2023-10-18 ENCOUNTER — Ambulatory Visit: Payer: Self-pay | Admitting: Family Medicine

## 2023-10-18 LAB — BASIC METABOLIC PANEL WITH GFR
BUN: 12 mg/dL (ref 6–23)
CO2: 20 meq/L (ref 19–32)
Calcium: 9.5 mg/dL (ref 8.4–10.5)
Chloride: 104 meq/L (ref 96–112)
Creatinine, Ser: 0.82 mg/dL (ref 0.40–1.20)
GFR: 72.68 mL/min (ref >60.00)
Glucose, Bld: 122 mg/dL — ABNORMAL HIGH (ref 70–99)
Potassium: 4.5 meq/L (ref 3.5–5.1)
Sodium: 136 meq/L (ref 135–145)

## 2023-10-23 ENCOUNTER — Ambulatory Visit: Payer: 59

## 2023-10-23 DIAGNOSIS — Z0000 Encounter for general adult medical examination without abnormal findings: Secondary | ICD-10-CM | POA: Diagnosis not present

## 2023-10-23 NOTE — Progress Notes (Signed)
 Subjective:   Diana Pittman is a 68 y.o. female who presents for Medicare Annual (Subsequent) preventive examination.  Visit Complete: Virtual I connected with  Diana Pittman on 10/23/23 by a audio enabled telemedicine application and verified that I am speaking with the correct person using two identifiers.  Patient Location: Home  Provider Location: Home Office  I discussed the limitations of evaluation and management by telemedicine. The patient expressed understanding and agreed to proceed.  Vital Signs: Because this visit was a virtual/telehealth visit, some criteria may be missing or patient reported. Any vitals not documented were not able to be obtained and vitals that have been documented are patient reported.    Cardiac Risk Factors include: advanced age (>62men, >28 women)     Objective:     There were no vitals filed for this visit. There is no height or weight on file to calculate BMI.     10/23/2023    1:52 PM 10/17/2022    2:43 PM 10/14/2021    9:11 AM 10/12/2020   12:48 PM 04/01/2017    2:58 PM  Advanced Directives  Does Patient Have a Medical Advance Directive? No No No No No  Does patient want to make changes to medical advance directive?    Yes (MAU/Ambulatory/Procedural Areas - Information given)   Would patient like information on creating a medical advance directive? No - Patient declined No - Patient declined Yes (MAU/Ambulatory/Procedural Areas - Information given)      Current Medications (verified) Outpatient Encounter Medications as of 10/23/2023  Medication Sig   alprazolam  (XANAX ) 2 MG tablet TAKE 1 TABLET BY MOUTH FOUR TIMES DAILY AS NEEDED FOR ANXIETY   citalopram  (CELEXA ) 40 MG tablet Take 1 tablet (40 mg total) by mouth daily.   fexofenadine -pseudoephedrine (ALLEGRA-D) 60-120 MG 12 hr tablet 1 tab po qAM for allergy symptoms   fluticasone  (FLONASE ) 50 MCG/ACT nasal spray INSTILL TWO SPRAYS IN EACH NOSTRIL EVERY DAY   multivitamin-lutein  (OCUVITE-LUTEIN) CAPS capsule Take 1 capsule by mouth daily.   potassium chloride  SA (KLOR-CON  M) 20 MEQ tablet Take 2 tablets (40 mEq total) by mouth 2 (two) times daily.   No facility-administered encounter medications on file as of 10/23/2023.    Allergies (verified) Patient has no known allergies.   History: Past Medical History:  Diagnosis Date   Anxiety and depression    Colon cancer screening    08/2022 cologuard neg   Herpes zoster 03/2017   R side of abdomen   Hypercholesterolemia 06/2020   TLC.  fmhm 2024 7%   Hypokalemia    History reviewed. No pertinent surgical history. Family History  Problem Relation Age of Onset   Mental illness Mother    Arthritis Mother    Hypertension Mother    Stroke Mother    Diabetes Mother    Mental illness Father    Arthritis Father    Hypertension Father    Stroke Father    Diabetes Father    Social History   Socioeconomic History   Marital status: Married    Spouse name: Not on file   Number of children: Not on file   Years of education: Not on file   Highest education level: Not on file  Occupational History   Occupation: retired  Tobacco Use   Smoking status: Never   Smokeless tobacco: Never  Substance and Sexual Activity   Alcohol use: No   Drug use: No   Sexual activity: Not Currently  Other Topics Concern  Not on file  Social History Narrative   Married, 3 sons.   Housewife.   10th grade education.   No T/A/Ds.   Social Drivers of Corporate investment banker Strain: Low Risk  (10/23/2023)   Overall Financial Resource Strain (CARDIA)    Difficulty of Paying Living Expenses: Not hard at all  Food Insecurity: No Food Insecurity (10/23/2023)   Hunger Vital Sign    Worried About Running Out of Food in the Last Year: Never true    Ran Out of Food in the Last Year: Never true  Transportation Needs: No Transportation Needs (10/23/2023)   PRAPARE - Administrator, Civil Service (Medical): No    Lack  of Transportation (Non-Medical): No  Physical Activity: Inactive (10/23/2023)   Exercise Vital Sign    Days of Exercise per Week: 0 days    Minutes of Exercise per Session: 0 min  Stress: No Stress Concern Present (10/23/2023)   [employer] of Occupational Health - Occupational Stress Questionnaire    Feeling of Stress : Not at all  Social Connections: Moderately Integrated (10/23/2023)   Social Connection and Isolation Panel [NHANES]    Frequency of Communication with Friends and Family: More than three times a week    Frequency of Social Gatherings with Friends and Family: Three times a week    Attends Religious Services: More than 4 times per year    Active Member of Clubs or Organizations: No    Attends Banker Meetings: Never    Marital Status: Married    Tobacco Counseling Counseling given: Not Answered   Clinical Intake:  Pre-visit preparation completed: Yes  Pain : No/denies pain     Diabetes: No  How often do you need to have someone help you when you read instructions, pamphlets, or other written materials from your doctor or pharmacy?: 1 - Never  Interpreter Needed?: No  Information entered by :: Kieth Pelt LPN   Activities of Daily Living    10/23/2023    1:55 PM  In your present state of health, do you have any difficulty performing the following activities:  Hearing? 0  Vision? 0  Difficulty concentrating or making decisions? 0  Walking or climbing stairs? 0  Dressing or bathing? 0  Doing errands, shopping? 0  Preparing Food and eating ? N  Using the Toilet? N  In the past six months, have you accidently leaked urine? N  Do you have problems with loss of bowel control? N  Managing your Medications? N  Managing your Finances? N  Housekeeping or managing your Housekeeping? N    Patient Care Team: Shelvia Dick, MD as PCP - General (Family Medicine)  Indicate any recent Medical Services you may have received from other than  [facility] providers in the past year (date may be approximate).     Assessment:    This is a routine wellness examination for Diana Pittman.  Hearing/Vision screen Hearing Screening - Comments:: No trouble hearing Vision Screening - Comments:: Up to date Unsure of name   Goals Addressed             This Visit's Progress    Patient Stated   On track    Drink more water     Patient Stated   Not on track    Get motivation to be active      Patient Stated       More energetic       Depression Screen  10/23/2023    1:57 PM 10/17/2022    2:40 PM 09/21/2022   10:00 AM 07/17/2021    3:12 PM 01/26/2021   11:38 AM 10/12/2020   12:52 PM 07/11/2020   11:34 AM  PHQ 2/9 Scores  PHQ - 2 Score 0 1 2 3 4 1 3   PHQ- 9 Score 0 1 6 9 11  11     Fall Risk    10/23/2023    1:49 PM 10/17/2022    2:45 PM 09/21/2022   10:00 AM 10/14/2021    9:13 AM 07/17/2021    2:19 PM  Fall Risk   Falls in the past year? 0 0 0 0 0  Number falls in past yr: 0 0 1 0 0  Injury with Fall? 0 0 0 0 0  Risk for fall due to :  Impaired vision No Fall Risks    Follow up Falls evaluation completed;Education provided;Falls prevention discussed Falls prevention discussed Falls evaluation completed Falls evaluation completed     MEDICARE RISK AT HOME: Medicare Risk at Home Any stairs in or around the home?: No If so, are there any without handrails?: No Home free of loose throw rugs in walkways, pet beds, electrical cords, etc?: Yes Adequate lighting in your home to reduce risk of falls?: Yes Life alert?: No Use of a cane, walker or w/c?: No Grab bars in the bathroom?: Yes Shower chair or bench in shower?: No Elevated toilet seat or a handicapped toilet?: No  TIMED UP AND GO:  Was the test performed?  No    Cognitive Function:        10/23/2023    1:54 PM 10/17/2022    2:46 PM 10/14/2021    9:25 AM  6CIT Screen  What Year? 0 points 0 points 0 points  What month? 0 points 0 points 0 points  What time? 0 points 0  points 0 points  Count back from 20 0 points 0 points 0 points  Months in reverse 0 points 0 points 0 points  Repeat phrase 0 points 0 points 2 points  Total Score 0 points 0 points 2 points    Immunizations Immunization History  Administered Date(s) Administered   Fluad Quad(high Dose 65+) 04/11/2020   PNEUMOCOCCAL CONJUGATE-20 02/08/2022   Pneumococcal Polysaccharide-23 04/11/2020   Td 05/15/2011    TDAP status: Due, Education has been provided regarding the importance of this vaccine. Advised may receive this vaccine at local pharmacy or Health Dept. Aware to provide a copy of the vaccination record if obtained from local pharmacy or Health Dept. Verbalized acceptance and understanding.  Flu Vaccine status: Up to date  Pneumococcal vaccine status: Up to date  Covid-19 vaccine status: Declined, Education has been provided regarding the importance of this vaccine but patient still declined. Advised may receive this vaccine at local pharmacy or Health Dept.or vaccine clinic. Aware to provide a copy of the vaccination record if obtained from local pharmacy or Health Dept. Verbalized acceptance and understanding.  Qualifies for Shingles Vaccine? Yes   Zostavax completed No   Shingrix  Completed?: No.    Education has been provided regarding the importance of this vaccine. Patient has been advised to call insurance company to determine out of pocket expense if they have not yet received this vaccine. Advised may also receive vaccine at local pharmacy or Health Dept. Verbalized acceptance and understanding.  Screening Tests Health Maintenance  Topic Date Due   Zoster Vaccines- Shingrix  (1 of 2) Never done  COVID-19 Vaccine (1) 11/08/2023 (Originally 10/15/1960)   DTaP/Tdap/Td (2 - Tdap) 04/08/2024 (Originally 05/14/2021)   MAMMOGRAM  10/22/2024 (Originally 10/15/2003)   DEXA SCAN  10/22/2024 (Originally 10/15/2018)   INFLUENZA VACCINE  12/13/2023   Medicare Annual Wellness (AWV)  10/22/2024    Fecal DNA (Cologuard)  08/26/2025   Pneumonia Vaccine 31+ Years old  Completed   HPV VACCINES  Aged Out   Meningococcal B Vaccine  Aged Out   Hepatitis C Screening  Discontinued    Health Maintenance  Health Maintenance Due  Topic Date Due   Zoster Vaccines- Shingrix  (1 of 2) Never done    Colorectal cancer screening: Type of screening: Cologuard. Completed 2024. Repeat every 3 years  Mammogram declined   Education provided  Bone Density Declined Education provided  Lung Cancer Screening: (Low Dose CT Chest recommended if Age 38-80 years, 20 pack-year currently smoking OR have quit w/in 15years.) does not qualify.   Lung Cancer Screening Referral:   Additional Screening:  Hepatitis C Screening never done  Vision Screening: Recommended annual ophthalmology exams for early detection of glaucoma and other disorders of the eye. Is the patient up to date with their annual eye exam?  Yes  Who is the provider or what is the name of the office in which the patient attends annual eye exams? Unsure of name If pt is not established with a provider, would they like to be referred to a provider to establish care? No .   Dental Screening: Recommended annual dental exams for proper oral hygiene    Community Resource Referral / Chronic Care Management: CRR required this visit?  No   CCM required this visit?  No     Plan:     I have personally reviewed and noted the following in the patient's chart:   Medical and social history Use of alcohol, tobacco or illicit drugs  Current medications and supplements including opioid prescriptions. Patient is not currently taking opioid prescriptions. Functional ability and status Nutritional status Physical activity Advanced directives List of other physicians Hospitalizations, surgeries, and ER visits in previous 12 months Vitals Screenings to include cognitive, depression, and falls Referrals and appointments  In addition, I  have reviewed and discussed with patient certain preventive protocols, quality metrics, and best practice recommendations. A written personalized care plan for preventive services as well as general preventive health recommendations were provided to patient.     Kieth Pelt, LPN   1/61/0960   After Visit Summary: (MyChart) Due to this being a telephonic visit, the after visit summary with patients personalized plan was offered to patient via MyChart   Nurse Notes:

## 2023-10-23 NOTE — Patient Instructions (Signed)
 Ms. Bujak , Thank you for taking time to come for your Medicare Wellness Visit. I appreciate your ongoing commitment to your health goals. Please review the following plan we discussed and let me know if I can assist you in the future.   Screening recommendations/referrals: Colonoscopy: up to date Mammogram: Education provided Bone Density: Education provided Recommended yearly ophthalmology/optometry visit for glaucoma screening and checkup Recommended yearly dental visit for hygiene and checkup  Vaccinations: Influenza vaccine: up tod ate Pneumococcal vaccine: up to date Tdap vaccine: Education provided Shingles vaccine: Education provided    Advanced directives: Education provided    Preventive Care 65 Years and Older, Female Preventive care refers to lifestyle choices and visits with your health care provider that can promote health and wellness. What does preventive care include? A yearly physical exam. This is also called an annual well check. Dental exams once or twice a year. Routine eye exams. Ask your health care provider how often you should have your eyes checked. Personal lifestyle choices, including: Daily care of your teeth and gums. Regular physical activity. Eating a healthy diet. Avoiding tobacco and drug use. Limiting alcohol use. Practicing safe sex. Taking low-dose aspirin every day. Taking vitamin and mineral supplements as recommended by your health care provider. What happens during an annual well check? The services and screenings done by your health care provider during your annual well check will depend on your age, overall health, lifestyle risk factors, and family history of disease. Counseling  Your health care provider may ask you questions about your: Alcohol use. Tobacco use. Drug use. Emotional well-being. Home and relationship well-being. Sexual activity. Eating habits. History of falls. Memory and ability to understand  (cognition). Work and work Astronomer. Reproductive health. Screening  You may have the following tests or measurements: Height, weight, and BMI. Blood pressure. Lipid and cholesterol levels. These may be checked every 5 years, or more frequently if you are over 21 years old. Skin check. Lung cancer screening. You may have this screening every year starting at age 46 if you have a 30-pack-year history of smoking and currently smoke or have quit within the past 15 years. Fecal occult blood test (FOBT) of the stool. You may have this test every year starting at age 68. Flexible sigmoidoscopy or colonoscopy. You may have a sigmoidoscopy every 5 years or a colonoscopy every 10 years starting at age 27. Hepatitis C blood test. Hepatitis B blood test. Sexually transmitted disease (STD) testing. Diabetes screening. This is done by checking your blood sugar (glucose) after you have not eaten for a while (fasting). You may have this done every 1-3 years. Bone density scan. This is done to screen for osteoporosis. You may have this done starting at age 42. Mammogram. This may be done every 1-2 years. Talk to your health care provider about how often you should have regular mammograms. Talk with your health care provider about your test results, treatment options, and if necessary, the need for more tests. Vaccines  Your health care provider may recommend certain vaccines, such as: Influenza vaccine. This is recommended every year. Tetanus, diphtheria, and acellular pertussis (Tdap, Td) vaccine. You may need a Td booster every 10 years. Zoster vaccine. You may need this after age 81. Pneumococcal 13-valent conjugate (PCV13) vaccine. One dose is recommended after age 71. Pneumococcal polysaccharide (PPSV23) vaccine. One dose is recommended after age 32. Talk to your health care provider about which screenings and vaccines you need and how often you need them.  This information is not intended to  replace advice given to you by your health care provider. Make sure you discuss any questions you have with your health care provider. Document Released: 05/27/2015 Document Revised: 01/18/2016 Document Reviewed: 03/01/2015 Elsevier Interactive Patient Education  2017 [employer].  Fall Prevention in the Home Falls can cause injuries. They can happen to people of all ages. There are many things you can do to make your home safe and to help prevent falls. What can I do on the outside of my home? Regularly fix the edges of walkways and driveways and fix any cracks. Remove anything that might make you trip as you walk through a door, such as a raised step or threshold. Trim any bushes or trees on the path to your home. Use bright outdoor lighting. Clear any walking paths of anything that might make someone trip, such as rocks or tools. Regularly check to see if handrails are loose or broken. Make sure that both sides of any steps have handrails. Any raised decks and porches should have guardrails on the edges. Have any leaves, snow, or ice cleared regularly. Use sand or salt on walking paths during winter. Clean up any spills in your garage right away. This includes oil or grease spills. What can I do in the bathroom? Use night lights. Install grab bars by the toilet and in the tub and shower. Do not use towel bars as grab bars. Use non-skid mats or decals in the tub or shower. If you need to sit down in the shower, use a plastic, non-slip stool. Keep the floor dry. Clean up any water that spills on the floor as soon as it happens. Remove soap buildup in the tub or shower regularly. Attach bath mats securely with double-sided non-slip rug tape. Do not have throw rugs and other things on the floor that can make you trip. What can I do in the bedroom? Use night lights. Make sure that you have a light by your bed that is easy to reach. Do not use any sheets or blankets that are too big for  your bed. They should not hang down onto the floor. Have a firm chair that has side arms. You can use this for support while you get dressed. Do not have throw rugs and other things on the floor that can make you trip. What can I do in the kitchen? Clean up any spills right away. Avoid walking on wet floors. Keep items that you use a lot in easy-to-reach places. If you need to reach something above you, use a strong step stool that has a grab bar. Keep electrical cords out of the way. Do not use floor polish or wax that makes floors slippery. If you must use wax, use non-skid floor wax. Do not have throw rugs and other things on the floor that can make you trip. What can I do with my stairs? Do not leave any items on the stairs. Make sure that there are handrails on both sides of the stairs and use them. Fix handrails that are broken or loose. Make sure that handrails are as long as the stairways. Check any carpeting to make sure that it is firmly attached to the stairs. Fix any carpet that is loose or worn. Avoid having throw rugs at the top or bottom of the stairs. If you do have throw rugs, attach them to the floor with carpet tape. Make sure that you have a light switch at the  top of the stairs and the bottom of the stairs. If you do not have them, ask someone to add them for you. What else can I do to help prevent falls? Wear shoes that: Do not have high heels. Have rubber bottoms. Are comfortable and fit you well. Are closed at the toe. Do not wear sandals. If you use a stepladder: Make sure that it is fully opened. Do not climb a closed stepladder. Make sure that both sides of the stepladder are locked into place. Ask someone to hold it for you, if possible. Clearly mark and make sure that you can see: Any grab bars or handrails. First and last steps. Where the edge of each step is. Use tools that help you move around (mobility aids) if they are needed. These  include: Canes. Walkers. Scooters. Crutches. Turn on the lights when you go into a dark area. Replace any light bulbs as soon as they burn out. Set up your furniture so you have a clear path. Avoid moving your furniture around. If any of your floors are uneven, fix them. If there are any pets around you, be aware of where they are. Review your medicines with your doctor. Some medicines can make you feel dizzy. This can increase your chance of falling. Ask your doctor what other things that you can do to help prevent falls. This information is not intended to replace advice given to you by your health care provider. Make sure you discuss any questions you have with your health care provider. Document Released: 02/24/2009 Document Revised: 10/06/2015 Document Reviewed: 06/04/2014 Elsevier Interactive Patient Education  2017 [employer].

## 2024-01-05 ENCOUNTER — Other Ambulatory Visit: Payer: Self-pay | Admitting: Family Medicine

## 2024-01-05 DIAGNOSIS — E876 Hypokalemia: Secondary | ICD-10-CM

## 2024-02-18 DIAGNOSIS — H353131 Nonexudative age-related macular degeneration, bilateral, early dry stage: Secondary | ICD-10-CM | POA: Diagnosis not present

## 2024-03-02 ENCOUNTER — Other Ambulatory Visit: Payer: Self-pay | Admitting: Family Medicine

## 2024-03-02 DIAGNOSIS — E876 Hypokalemia: Secondary | ICD-10-CM

## 2024-03-12 ENCOUNTER — Other Ambulatory Visit: Payer: Self-pay | Admitting: Family Medicine

## 2024-03-12 DIAGNOSIS — E876 Hypokalemia: Secondary | ICD-10-CM

## 2024-03-12 NOTE — Telephone Encounter (Signed)
 Requesting: alprazolam   Contract: 10/09/23 UDS: 10/09/23 Last Visit: 10/09/23 Next Visit: 04/16/24 Last Refill: 08/19/23 (120,5)  Please Advise. Med pending

## 2024-04-01 ENCOUNTER — Other Ambulatory Visit: Payer: Self-pay | Admitting: Family Medicine

## 2024-04-03 ENCOUNTER — Other Ambulatory Visit: Payer: Self-pay | Admitting: Family Medicine

## 2024-04-10 ENCOUNTER — Encounter: Admitting: Family Medicine

## 2024-04-11 ENCOUNTER — Other Ambulatory Visit: Payer: Self-pay | Admitting: Family Medicine

## 2024-04-11 DIAGNOSIS — E876 Hypokalemia: Secondary | ICD-10-CM

## 2024-04-13 NOTE — Telephone Encounter (Signed)
 Pt has upcoming appt on 12/4

## 2024-04-15 ENCOUNTER — Other Ambulatory Visit: Payer: Self-pay

## 2024-04-15 DIAGNOSIS — E876 Hypokalemia: Secondary | ICD-10-CM

## 2024-04-15 MED ORDER — POTASSIUM CHLORIDE CRYS ER 20 MEQ PO TBCR: 40.0000 meq | EXTENDED_RELEASE_TABLET | Freq: Two times a day (BID) | ORAL | 1 refills | Status: DC

## 2024-04-15 MED ORDER — FLUTICASONE PROPIONATE 50 MCG/ACT NA SUSP: NASAL | 1 refills | Status: AC

## 2024-04-16 ENCOUNTER — Encounter: Payer: Self-pay | Admitting: Family Medicine

## 2024-04-16 ENCOUNTER — Ambulatory Visit: Admitting: Family Medicine

## 2024-04-16 VITALS — BP 116/70 | HR 89 | Temp 98.3°F | Ht 61.5 in | Wt 140.4 lb

## 2024-04-16 DIAGNOSIS — E876 Hypokalemia: Secondary | ICD-10-CM | POA: Diagnosis not present

## 2024-04-16 DIAGNOSIS — E7800 Pure hypercholesterolemia, unspecified: Secondary | ICD-10-CM

## 2024-04-16 DIAGNOSIS — Z0000 Encounter for general adult medical examination without abnormal findings: Secondary | ICD-10-CM | POA: Diagnosis not present

## 2024-04-16 DIAGNOSIS — F411 Generalized anxiety disorder: Secondary | ICD-10-CM | POA: Diagnosis not present

## 2024-04-16 DIAGNOSIS — Z79899 Other long term (current) drug therapy: Secondary | ICD-10-CM

## 2024-04-16 DIAGNOSIS — F3342 Major depressive disorder, recurrent, in full remission: Secondary | ICD-10-CM | POA: Diagnosis not present

## 2024-04-16 LAB — LIPID PANEL
Cholesterol: 191 mg/dL (ref 0–200)
HDL: 49.6 mg/dL (ref >39.00)
LDL Cholesterol: 111 mg/dL — ABNORMAL HIGH (ref 0–99)
NonHDL: 141.07
Total CHOL/HDL Ratio: 4
Triglycerides: 149 mg/dL (ref 0.0–149.0)
VLDL: 29.8 mg/dL (ref 0.0–40.0)

## 2024-04-16 LAB — COMPREHENSIVE METABOLIC PANEL WITH GFR
ALT: 9 U/L (ref 0–35)
AST: 17 U/L (ref 0–37)
Albumin: 4.3 g/dL (ref 3.5–5.2)
Alkaline Phosphatase: 87 U/L (ref 39–117)
BUN: 11 mg/dL (ref 6–23)
CO2: 27 meq/L (ref 19–32)
Calcium: 9.5 mg/dL (ref 8.4–10.5)
Chloride: 106 meq/L (ref 96–112)
Creatinine, Ser: 0.7 mg/dL (ref 0.40–1.20)
GFR: 87.57 mL/min (ref >60.00)
Glucose, Bld: 95 mg/dL (ref 70–99)
Potassium: 3.8 meq/L (ref 3.5–5.1)
Sodium: 140 meq/L (ref 135–145)
Total Bilirubin: 0.5 mg/dL (ref 0.2–1.2)
Total Protein: 7.3 g/dL (ref 6.0–8.3)

## 2024-04-16 LAB — CBC WITH DIFFERENTIAL/PLATELET
Basophils Absolute: 0.1 K/uL (ref 0.0–0.1)
Basophils Relative: 1.2 % (ref 0.0–3.0)
Eosinophils Absolute: 0.2 K/uL (ref 0.0–0.7)
Eosinophils Relative: 2.3 % (ref 0.0–5.0)
HCT: 39.4 % (ref 36.0–46.0)
Hemoglobin: 13.1 g/dL (ref 12.0–15.0)
Lymphocytes Relative: 21.7 % (ref 12.0–46.0)
Lymphs Abs: 1.6 K/uL (ref 0.7–4.0)
MCHC: 33.4 g/dL (ref 30.0–36.0)
MCV: 88.4 fl (ref 78.0–100.0)
Monocytes Absolute: 0.6 K/uL (ref 0.1–1.0)
Monocytes Relative: 7.5 % (ref 3.0–12.0)
Neutro Abs: 5 K/uL (ref 1.4–7.7)
Neutrophils Relative %: 67.3 % (ref 43.0–77.0)
Platelets: 253 K/uL (ref 150.0–400.0)
RBC: 4.45 Mil/uL (ref 3.87–5.11)
RDW: 13.4 % (ref 11.5–15.5)
WBC: 7.4 K/uL (ref 4.0–10.5)

## 2024-04-16 MED ORDER — CITALOPRAM HYDROBROMIDE 40 MG PO TABS: 40.0000 mg | ORAL_TABLET | Freq: Every day | ORAL | 3 refills | Status: AC

## 2024-04-16 MED ORDER — FEXOFENADINE-PSEUDOEPHED ER 60-120 MG PO TB12: ORAL_TABLET | ORAL | 3 refills | Status: AC

## 2024-04-16 NOTE — Patient Instructions (Signed)

## 2024-04-16 NOTE — Progress Notes (Signed)
 Office Note 04/16/2024  CC:  Chief Complaint  Patient presents with   Annual Exam    Pt is fasting   Patient is a 68 y.o. female who is here for follow-up GAD/hx depression, HLD, and hx hypokalemia. A/P as of last visit: #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 In remission. Continue citalopram  40 mg a day and alprazolam  2 mg 4 times daily. Controlled substance contract updated. Urine drug screen today.   2.  Hypercholesterolemia.  LDL was 152 about 6 months ago.  Goal less than 100. Fortunately, her HDL is good--> 62. Discussed appropriate dietary modification and activity modification today.  We will repeat lipids in 6 months. No medication at this time.   #3 history of hypokalemia. She is on 20 mill equivalents of K-Dur twice daily. Monitor renal function and potassium today.  INTERIM HX: Diana Pittman is feeling well.  Mood and anxiety levels stable. PMP AWARE reviewed today: most recent rx for alprazolam  was filled 03/13/2024, # 120, rx by me. No red flags.   ROS --> no fevers, no CP, no SOB, no wheezing, no cough, no dizziness, no HAs, no rashes, no melena/hematochezia.  No polyuria or polydipsia.  No myalgias or arthralgias.  No focal weakness, paresthesias, or tremors.  No acute vision or hearing abnormalities.  No dysuria or unusual/new urinary urgency or frequency.  No recent changes in lower legs. No n/v/d or abd pain.  No palpitations.    Past Medical History:  Diagnosis Date   Anxiety and depression    Colon cancer screening    08/2022 cologuard neg   Herpes zoster 03/2017   R side of abdomen   Hypercholesterolemia 06/2020   TLC.  fmhm 2024 7%   Hypokalemia     History reviewed. No pertinent surgical history.  Family History  Problem Relation Age of Onset   Mental illness Mother    Arthritis Mother    Hypertension Mother    Stroke Mother    Diabetes Mother    Mental illness Father    Arthritis Father    Hypertension Father    Stroke Father     Diabetes Father     Social History   Socioeconomic History   Marital status: Married    Spouse name: Not on file   Number of children: Not on file   Years of education: Not on file   Highest education level: Not on file  Occupational History   Occupation: retired  Tobacco Use   Smoking status: Never   Smokeless tobacco: Never  Substance and Sexual Activity   Alcohol use: No   Drug use: No   Sexual activity: Not Currently  Other Topics Concern   Not on file  Social History Narrative   Married, 3 sons.   Housewife.   10th grade education.   No T/A/Ds.   Social Drivers of Corporate Investment Banker Strain: Low Risk  (10/23/2023)   Overall Financial Resource Strain (CARDIA)    Difficulty of Paying Living Expenses: Not hard at all  Food Insecurity: No Food Insecurity (10/23/2023)   Hunger Vital Sign    Worried About Running Out of Food in the Last Year: Never true    Ran Out of Food in the Last Year: Never true  Transportation Needs: No Transportation Needs (10/23/2023)   PRAPARE - Administrator, Civil Service (Medical): No    Lack of Transportation (Non-Medical): No  Physical Activity: Inactive (10/23/2023)   Exercise Vital Sign  Days of Exercise per Week: 0 days    Minutes of Exercise per Session: 0 min  Stress: No Stress Concern Present (10/23/2023)   [employer] of Occupational Health - Occupational Stress Questionnaire    Feeling of Stress : Not at all  Social Connections: Moderately Integrated (10/23/2023)   Social Connection and Isolation Panel    Frequency of Communication with Friends and Family: More than three times a week    Frequency of Social Gatherings with Friends and Family: Three times a week    Attends Religious Services: More than 4 times per year    Active Member of Clubs or Organizations: No    Attends Banker Meetings: Never    Marital Status: Married  Catering Manager Violence: Not At Risk (10/23/2023)    Humiliation, Afraid, Rape, and Kick questionnaire    Fear of Current or Ex-Partner: No    Emotionally Abused: No    Physically Abused: No    Sexually Abused: No    Outpatient Medications Prior to Visit  Medication Sig Dispense Refill   alprazolam  (XANAX ) 2 MG tablet TAKE 1 TABLET BY MOUTH FOUR TIMES DAILY AS NEEDED FOR ANXIETY 120 tablet 5   fluticasone  (FLONASE ) 50 MCG/ACT nasal spray INSTILL TWO SPRAYS IN EACH NOSTRIL EVERY DAY 48 g 1   multivitamin-lutein (OCUVITE-LUTEIN) CAPS capsule Take 1 capsule by mouth daily.     potassium chloride  SA (KLOR-CON  M) 20 MEQ tablet Take 2 tablets (40 mEq total) by mouth 2 (two) times daily. 120 tablet 1   citalopram  (CELEXA ) 40 MG tablet TAKE 1 TABLET BY MOUTH DAILY 30 tablet 0   fexofenadine -pseudoephedrine (ALLEGRA-D) 60-120 MG 12 hr tablet 1 tab po qAM for allergy symptoms 90 tablet 1   No facility-administered medications prior to visit.    No Known Allergies   PE;    04/16/2024   10:14 AM 10/09/2023    9:43 AM 04/09/2023   10:11 AM  Vitals with BMI  Height 5' 1.5 5' 2 5' 2  Weight 140 lbs 6 oz 143 lbs 13 oz 141 lbs 13 oz  BMI 26.1 26.29 25.93  Systolic 116 119 889  Diastolic 70 75 75  Pulse 89 79 71     Gen: Alert, well appearing.  Patient is oriented to person, place, time, and situation. AFFECT: pleasant, lucid thought and speech. CV: RRR, no m/r/g.   LUNGS: CTA bilat, nonlabored resps, good aeration in all lung fields.  Pertinent labs:   Lab Results  Component Value Date   WBC 4.8 04/09/2023   HGB 13.6 04/09/2023   HCT 41.2 04/09/2023   MCV 91.9 04/09/2023   PLT 219.0 04/09/2023   Lab Results  Component Value Date   CREATININE 0.82 10/17/2023   BUN 12 10/17/2023   NA 136 10/17/2023   K 4.5 10/17/2023   CL 104 10/17/2023   CO2 20 10/17/2023   Lab Results  Component Value Date   ALT 8 04/09/2023   AST 19 04/09/2023   ALKPHOS 65 04/09/2023   BILITOT 0.6 04/09/2023   Lab Results  Component Value Date    CHOL 235 (H) 04/09/2023   Lab Results  Component Value Date   HDL 62.00 04/09/2023   Lab Results  Component Value Date   LDLCALC 152 (H) 04/09/2023   Lab Results  Component Value Date   TRIG 103.0 04/09/2023   Lab Results  Component Value Date   CHOLHDL 4 04/09/2023   ASSESSMENT AND PLAN:   1 GAD,  history of depression. Stable on citalopram  40 mg a day and Xanax  2 mg 4 times daily. Controlled substance contract and urine drug screen are up-to-date.  2.  Hypercholesterolemia.  She prefers no medications if she can avoid it. Monitor lipid panel today.  3.  History of hypokalemia.  She will continue 40 mill equivalents Klor-Con  twice a day. Check metabolic panel today.  #4 preventative health care: Vaccines:  Tdap--> declined. Flu-->declined.  Shingrix ->declined. Labs: cmet, flp, cbc. Cervical ca screening: pt has declined. Breast ca screening: ?04/2022 (mobile)--> she declined Colon ca screening: Cologuard NEG April 2024.  Plan repeat April 2027. DEXA: ? 04/2022 mobile--> she declined.  An After Visit Summary was printed and given to the patient.  FOLLOW UP:  Return in about 6 months (around 10/15/2024) for routine chronic illness f/u.  Signed:  Gerlene Hockey, MD           04/16/2024

## 2024-04-17 ENCOUNTER — Ambulatory Visit: Payer: Self-pay | Admitting: Family Medicine

## 2024-06-01 ENCOUNTER — Other Ambulatory Visit: Payer: Self-pay | Admitting: Family Medicine

## 2024-06-01 DIAGNOSIS — E876 Hypokalemia: Secondary | ICD-10-CM

## 2024-10-15 ENCOUNTER — Ambulatory Visit: Admitting: Family Medicine

## 2024-10-28 ENCOUNTER — Encounter
